# Patient Record
Sex: Male | Born: 1970 | Race: White | Hispanic: No | Marital: Married | Smoking: Never smoker
Health system: Southern US, Community
[De-identification: ages and names within clinical notes are randomized; demographics above are authoritative.]

## PROBLEM LIST (undated history)

## (undated) DIAGNOSIS — I2510 Atherosclerotic heart disease of native coronary artery without angina pectoris: Secondary | ICD-10-CM

## (undated) DIAGNOSIS — E7800 Pure hypercholesterolemia, unspecified: Secondary | ICD-10-CM

## (undated) DIAGNOSIS — I213 ST elevation (STEMI) myocardial infarction of unspecified site: Secondary | ICD-10-CM

## (undated) DIAGNOSIS — I4891 Unspecified atrial fibrillation: Secondary | ICD-10-CM

## (undated) HISTORY — PX: MITRAL VALVE REPAIR: SHX2039

---

## 2014-04-17 ENCOUNTER — Encounter (HOSPITAL_COMMUNITY): Payer: Self-pay | Admitting: *Deleted

## 2014-04-17 ENCOUNTER — Emergency Department (HOSPITAL_COMMUNITY)
Admission: EM | Admit: 2014-04-17 | Discharge: 2014-04-17 | Disposition: A | Discharge status: Home / Self Care | Payer: 59 | Attending: Emergency Medicine | Admitting: Emergency Medicine

## 2014-04-17 DIAGNOSIS — S199XXA Unspecified injury of neck, initial encounter: Secondary | ICD-10-CM | POA: Diagnosis not present

## 2014-04-17 DIAGNOSIS — M542 Cervicalgia: Secondary | ICD-10-CM

## 2014-04-17 DIAGNOSIS — Y998 Other external cause status: Secondary | ICD-10-CM | POA: Diagnosis not present

## 2014-04-17 DIAGNOSIS — S3992XA Unspecified injury of lower back, initial encounter: Secondary | ICD-10-CM | POA: Diagnosis not present

## 2014-04-17 DIAGNOSIS — Y92410 Unspecified street and highway as the place of occurrence of the external cause: Secondary | ICD-10-CM | POA: Diagnosis not present

## 2014-04-17 DIAGNOSIS — Y9389 Activity, other specified: Secondary | ICD-10-CM | POA: Insufficient documentation

## 2014-04-17 MED ORDER — CYCLOBENZAPRINE HCL 10 MG PO TABS: 10.0000 mg | ORAL_TABLET | Freq: Two times a day (BID) | ORAL | Status: DC | PRN

## 2014-04-17 MED ORDER — KETOROLAC TROMETHAMINE 30 MG/ML IJ SOLN
30.0000 mg | Freq: Once | INTRAMUSCULAR | Status: AC
  Administered 2014-04-17: 30 mg via INTRAMUSCULAR
  Filled 2014-04-17: qty 1

## 2014-04-17 NOTE — Discharge Instructions (Signed)
Return to the emergency room with worsening of symptoms, new symptoms or with symptoms that are concerning , especially severe worsening of headache, visual or speech changes, weakness in face, arms or legs, numbness, tingling. RICE: Rest, Ice (three cycles of 20 mins on, 20mins off at least twice a day), compression/brace, elevation. Heating pad works well for back pain. Ibuprofen 400mg  (2 tablets 200mg ) every 5-6 hours for 3-5 days. Flexeril for severe pain. Do not operate machinery, drive or drink alcohol while taking narcotics or muscle relaxers. Follow up with PCP if symptoms worsen or are persistent. Read below information and follow all recommendations   Cervical Sprain A cervical sprain is an injury in the neck in which the strong, fibrous tissues (ligaments) that connect your neck bones stretch or tear. Cervical sprains can range from mild to severe. Severe cervical sprains can cause the neck vertebrae to be unstable. This can lead to damage of the spinal cord and can result in serious nervous system problems. The amount of time it takes for a cervical sprain to get better depends on the cause and extent of the injury. Most cervical sprains heal in 1 to 3 weeks. CAUSES  Severe cervical sprains may be caused by:   Contact sport injuries (such as from football, rugby, wrestling, hockey, auto racing, gymnastics, diving, martial arts, or boxing).   Motor vehicle collisions.   Whiplash injuries. This is an injury from a sudden forward and backward whipping movement of the head and neck.  Falls.  Mild cervical sprains may be caused by:   Being in an awkward position, such as while cradling a telephone between your ear and shoulder.   Sitting in a chair that does not offer proper support.   Working at a poorly Marketing executivedesigned computer station.   Looking up or down for long periods of time.  SYMPTOMS   Pain, soreness, stiffness, or a burning sensation in the front, back, or sides of  the neck. This discomfort may develop immediately after the injury or slowly, 24 hours or more after the injury.   Pain or tenderness directly in the middle of the back of the neck.   Shoulder or upper back pain.   Limited ability to move the neck.   Headache.   Dizziness.   Weakness, numbness, or tingling in the hands or arms.   Muscle spasms.   Difficulty swallowing or chewing.   Tenderness and swelling of the neck.  DIAGNOSIS  Most of the time your health care provider can diagnose a cervical sprain by taking your history and doing a physical exam. Your health care provider will ask about previous neck injuries and any known neck problems, such as arthritis in the neck. X-rays may be taken to find out if there are any other problems, such as with the bones of the neck. Other tests, such as a CT scan or MRI, may also be needed.  TREATMENT  Treatment depends on the severity of the cervical sprain. Mild sprains can be treated with rest, keeping the neck in place (immobilization), and pain medicines. Severe cervical sprains are immediately immobilized. Further treatment is done to help with pain, muscle spasms, and other symptoms and may include:  Medicines, such as pain relievers, numbing medicines, or muscle relaxants.   Physical therapy. This may involve stretching exercises, strengthening exercises, and posture training. Exercises and improved posture can help stabilize the neck, strengthen muscles, and help stop symptoms from returning.  HOME CARE INSTRUCTIONS   Put ice on  the injured area.   Put ice in a plastic bag.   Place a towel between your skin and the bag.   Leave the ice on for 15-20 minutes, 3-4 times a day.   If your injury was severe, you may have been given a cervical collar to wear. A cervical collar is a two-piece collar designed to keep your neck from moving while it heals.  Do not remove the collar unless instructed by your health care  provider.  If you have long hair, keep it outside of the collar.  Ask your health care provider before making any adjustments to your collar. Minor adjustments may be required over time to improve comfort and reduce pressure on your chin or on the back of your head.  Ifyou are allowed to remove the collar for cleaning or bathing, follow your health care provider's instructions on how to do so safely.  Keep your collar clean by wiping it with mild soap and water and drying it completely. If the collar you have been given includes removable pads, remove them every 1-2 days and hand wash them with soap and water. Allow them to air dry. They should be completely dry before you wear them in the collar.  If you are allowed to remove the collar for cleaning and bathing, wash and dry the skin of your neck. Check your skin for irritation or sores. If you see any, tell your health care provider.  Do not drive while wearing the collar.   Only take over-the-counter or prescription medicines for pain, discomfort, or fever as directed by your health care provider.   Keep all follow-up appointments as directed by your health care provider.   Keep all physical therapy appointments as directed by your health care provider.   Make any needed adjustments to your workstation to promote good posture.   Avoid positions and activities that make your symptoms worse.   Warm up and stretch before being active to help prevent problems.  SEEK MEDICAL CARE IF:   Your pain is not controlled with medicine.   You are unable to decrease your pain medicine over time as planned.   Your activity level is not improving as expected.  SEEK IMMEDIATE MEDICAL CARE IF:   You develop any bleeding.  You develop stomach upset.  You have signs of an allergic reaction to your medicine.   Your symptoms get worse.   You develop new, unexplained symptoms.   You have numbness, tingling, weakness, or paralysis  in any part of your body.  MAKE SURE YOU:   Understand these instructions.  Will watch your condition.  Will get help right away if you are not doing well or get worse. Document Released: 12/22/2006 Document Revised: 03/01/2013 Document Reviewed: 09/01/2012 [facility] Patient Information 2015 [place], [place]. This information is not intended to replace advice given to you by your health care provider. Make sure you discuss any questions you have with your health care provider.
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
## 2014-04-17 NOTE — ED Notes (Signed)
Declined W/C at D/C and was escorted to lobby by RN. 

## 2016-01-27 ENCOUNTER — Emergency Department (HOSPITAL_COMMUNITY): Payer: BLUE CROSS/BLUE SHIELD

## 2016-01-27 ENCOUNTER — Encounter (HOSPITAL_COMMUNITY): Payer: Self-pay

## 2016-01-27 ENCOUNTER — Inpatient Hospital Stay (HOSPITAL_COMMUNITY)
Admission: EM | Admit: 2016-01-27 | Discharge: 2016-02-01 | DRG: 247 | Disposition: A | Discharge status: Home / Self Care | Payer: BLUE CROSS/BLUE SHIELD | Attending: Cardiology | Admitting: Cardiology

## 2016-01-27 DIAGNOSIS — I2129 ST elevation (STEMI) myocardial infarction involving other sites: Secondary | ICD-10-CM | POA: Diagnosis present

## 2016-01-27 DIAGNOSIS — Z7901 Long term (current) use of anticoagulants: Secondary | ICD-10-CM | POA: Diagnosis not present

## 2016-01-27 DIAGNOSIS — I482 Chronic atrial fibrillation: Secondary | ICD-10-CM | POA: Diagnosis present

## 2016-01-27 DIAGNOSIS — I2510 Atherosclerotic heart disease of native coronary artery without angina pectoris: Secondary | ICD-10-CM | POA: Diagnosis present

## 2016-01-27 DIAGNOSIS — Z23 Encounter for immunization: Secondary | ICD-10-CM | POA: Diagnosis not present

## 2016-01-27 DIAGNOSIS — E7800 Pure hypercholesterolemia, unspecified: Secondary | ICD-10-CM | POA: Diagnosis present

## 2016-01-27 DIAGNOSIS — I10 Essential (primary) hypertension: Secondary | ICD-10-CM | POA: Diagnosis present

## 2016-01-27 DIAGNOSIS — I059 Rheumatic mitral valve disease, unspecified: Secondary | ICD-10-CM | POA: Diagnosis present

## 2016-01-27 DIAGNOSIS — I214 Non-ST elevation (NSTEMI) myocardial infarction: Secondary | ICD-10-CM | POA: Diagnosis present

## 2016-01-27 DIAGNOSIS — E785 Hyperlipidemia, unspecified: Secondary | ICD-10-CM | POA: Diagnosis present

## 2016-01-27 DIAGNOSIS — I213 ST elevation (STEMI) myocardial infarction of unspecified site: Secondary | ICD-10-CM

## 2016-01-27 DIAGNOSIS — Z952 Presence of prosthetic heart valve: Secondary | ICD-10-CM

## 2016-01-27 DIAGNOSIS — Z955 Presence of coronary angioplasty implant and graft: Secondary | ICD-10-CM

## 2016-01-27 DIAGNOSIS — R079 Chest pain, unspecified: Secondary | ICD-10-CM

## 2016-01-27 HISTORY — DX: ST elevation (STEMI) myocardial infarction of unspecified site: I21.3

## 2016-01-27 HISTORY — DX: Unspecified atrial fibrillation: I48.91

## 2016-01-27 HISTORY — DX: Pure hypercholesterolemia, unspecified: E78.00

## 2016-01-27 HISTORY — DX: Atherosclerotic heart disease of native coronary artery without angina pectoris: I25.10

## 2016-01-27 LAB — CBC WITH DIFFERENTIAL/PLATELET
BASOS PCT: 0 %
Basophils Absolute: 0 10*3/uL (ref 0.0–0.1)
EOS ABS: 0 10*3/uL (ref 0.0–0.7)
EOS PCT: 0 %
HCT: 41 % (ref 39.0–52.0)
HEMOGLOBIN: 13.7 g/dL (ref 13.0–17.0)
Lymphocytes Relative: 16 %
Lymphs Abs: 1.1 10*3/uL (ref 0.7–4.0)
MCH: 28.1 pg (ref 26.0–34.0)
MCHC: 33.4 g/dL (ref 30.0–36.0)
MCV: 84.2 fL (ref 78.0–100.0)
Monocytes Absolute: 0.4 10*3/uL (ref 0.1–1.0)
Monocytes Relative: 6 %
NEUTROS PCT: 78 %
Neutro Abs: 5.4 10*3/uL (ref 1.7–7.7)
PLATELETS: 151 10*3/uL (ref 150–400)
RBC: 4.87 MIL/uL (ref 4.22–5.81)
RDW: 14.4 % (ref 11.5–15.5)
WBC: 6.9 10*3/uL (ref 4.0–10.5)

## 2016-01-27 LAB — CBC
HEMATOCRIT: 43 % (ref 39.0–52.0)
Hemoglobin: 14.4 g/dL (ref 13.0–17.0)
MCH: 28.3 pg (ref 26.0–34.0)
MCHC: 33.5 g/dL (ref 30.0–36.0)
MCV: 84.5 fL (ref 78.0–100.0)
Platelets: 146 10*3/uL — ABNORMAL LOW (ref 150–400)
RBC: 5.09 MIL/uL (ref 4.22–5.81)
RDW: 14.3 % (ref 11.5–15.5)
WBC: 6.2 10*3/uL (ref 4.0–10.5)

## 2016-01-27 LAB — TROPONIN I: Troponin I: 2.4 ng/mL (ref <0.03)

## 2016-01-27 LAB — BASIC METABOLIC PANEL
Anion gap: 10 (ref 5–15)
BUN: 7 mg/dL (ref 6–20)
CO2: 23 mmol/L (ref 22–32)
CREATININE: 0.77 mg/dL (ref 0.61–1.24)
Calcium: 9.3 mg/dL (ref 8.9–10.3)
Chloride: 105 mmol/L (ref 101–111)
GFR calc Af Amer: >60 mL/min (ref >60)
GLUCOSE: 125 mg/dL — AB (ref 65–99)
Potassium: 3.8 mmol/L (ref 3.5–5.1)
Sodium: 138 mmol/L (ref 135–145)

## 2016-01-27 LAB — PROTIME-INR
INR: 2.18
PROTHROMBIN TIME: 24.6 s — AB (ref 11.4–15.2)

## 2016-01-27 MED ORDER — NITROGLYCERIN IN D5W 200-5 MCG/ML-% IV SOLN
0.0000 ug/min | INTRAVENOUS | Status: DC
Start: 2016-01-27 — End: 2016-02-01
  Administered 2016-01-27: 5 ug/min via INTRAVENOUS
  Administered 2016-01-28 – 2016-01-29 (×2): 45 ug/min via INTRAVENOUS
  Filled 2016-01-27 (×3): qty 250

## 2016-01-27 MED ORDER — ASPIRIN 300 MG RE SUPP: 300.0000 mg | RECTAL | Status: AC

## 2016-01-27 MED ORDER — METOPROLOL TARTRATE 12.5 MG HALF TABLET
12.5000 mg | ORAL_TABLET | Freq: Two times a day (BID) | ORAL | Status: DC
  Administered 2016-01-27 – 2016-01-28 (×2): 12.5 mg via ORAL
  Filled 2016-01-27 (×2): qty 1

## 2016-01-27 MED ORDER — NITROGLYCERIN 0.4 MG SL SUBL
0.4000 mg | SUBLINGUAL_TABLET | SUBLINGUAL | Status: DC | PRN
  Administered 2016-01-27 (×2): 0.4 mg via SUBLINGUAL
  Filled 2016-01-27: qty 1

## 2016-01-27 MED ORDER — SODIUM CHLORIDE 0.9 % IV SOLN: INTRAVENOUS | Status: DC

## 2016-01-27 MED ORDER — ASPIRIN EC 81 MG PO TBEC
81.0000 mg | DELAYED_RELEASE_TABLET | Freq: Every day | ORAL | Status: DC
  Administered 2016-01-28 – 2016-02-01 (×5): 81 mg via ORAL
  Filled 2016-01-27 (×5): qty 1

## 2016-01-27 MED ORDER — DILTIAZEM HCL 100 MG IV SOLR: 5.0000 mg/h | Freq: Once | INTRAVENOUS | Status: DC

## 2016-01-27 MED ORDER — ONDANSETRON HCL 4 MG/2ML IJ SOLN: 4.0000 mg | Freq: Four times a day (QID) | INTRAMUSCULAR | Status: DC | PRN

## 2016-01-27 MED ORDER — CLOPIDOGREL BISULFATE 75 MG PO TABS
600.0000 mg | ORAL_TABLET | Freq: Every day | ORAL | Status: DC
  Administered 2016-01-27: 600 mg via ORAL
  Filled 2016-01-27: qty 2

## 2016-01-27 MED ORDER — DILTIAZEM HCL 25 MG/5ML IV SOLN
10.0000 mg | Freq: Once | INTRAVENOUS | Status: AC
  Administered 2016-01-27: 10 mg via INTRAVENOUS
  Filled 2016-01-27: qty 5

## 2016-01-27 MED ORDER — TIROFIBAN HCL IN NACL 5-0.9 MG/100ML-% IV SOLN
0.1500 ug/kg/min | INTRAVENOUS | Status: DC
Start: 2016-01-27 — End: 2016-01-28
  Administered 2016-01-27 – 2016-01-28 (×3): 0.15 ug/kg/min via INTRAVENOUS
  Filled 2016-01-27 (×6): qty 100

## 2016-01-27 MED ORDER — CLOPIDOGREL BISULFATE 75 MG PO TABS
75.0000 mg | ORAL_TABLET | Freq: Every day | ORAL | Status: DC
  Administered 2016-01-28 – 2016-01-29 (×2): 75 mg via ORAL
  Filled 2016-01-27 (×2): qty 1

## 2016-01-27 MED ORDER — MORPHINE SULFATE (PF) 4 MG/ML IV SOLN
4.0000 mg | Freq: Once | INTRAVENOUS | Status: AC
  Administered 2016-01-27: 4 mg via INTRAVENOUS
  Filled 2016-01-27: qty 1

## 2016-01-27 MED ORDER — PANTOPRAZOLE SODIUM 40 MG PO TBEC
40.0000 mg | DELAYED_RELEASE_TABLET | Freq: Every day | ORAL | Status: DC
  Administered 2016-01-28 – 2016-02-01 (×5): 40 mg via ORAL
  Filled 2016-01-27 (×5): qty 1

## 2016-01-27 MED ORDER — ACETAMINOPHEN 325 MG PO TABS: 650.0000 mg | ORAL_TABLET | ORAL | Status: DC | PRN

## 2016-01-27 MED ORDER — ATORVASTATIN CALCIUM 80 MG PO TABS
80.0000 mg | ORAL_TABLET | Freq: Every day | ORAL | Status: DC
  Administered 2016-01-28 – 2016-01-31 (×3): 80 mg via ORAL
  Filled 2016-01-27 (×3): qty 1

## 2016-01-27 MED ORDER — TIROFIBAN (AGGRASTAT) BOLUS VIA INFUSION
25.0000 ug/kg | Freq: Once | INTRAVENOUS | Status: AC
  Administered 2016-01-27: 1902.5 ug via INTRAVENOUS
  Filled 2016-01-27: qty 39

## 2016-01-27 MED ORDER — ASPIRIN 81 MG PO CHEW
324.0000 mg | CHEWABLE_TABLET | ORAL | Status: AC
  Administered 2016-01-27: 324 mg via ORAL
  Filled 2016-01-27: qty 4

## 2016-01-27 NOTE — Progress Notes (Signed)
eLink Physician-Brief Progress Note Patient Name: Erik BlacksmithHany Simpson DOB: 07/03/1970 MRN: [id number]   Date of Service  01/27/2016  HPI/Events of Note  Non-Qwave MI Admitted by cardiology Stable on camera check  eICU Interventions  No e-ICU intervention     Intervention Category Evaluation Type: New Patient Evaluation  Max FickleDouglas McQuaid 01/27/2016, 11:35 PM

## 2016-01-27 NOTE — ED Notes (Signed)
Patient transported to X-ray 

## 2016-01-27 NOTE — ED Notes (Signed)
Harwani, MD at bedside. 

## 2016-01-27 NOTE — H&P (Signed)
Erik Simpson is an 45 y.o. male.   Chief Complaint: Recurrent chest pain HPI: Patient is 45 year old male with past medical history significant for rheumatic heart disease chronic atrial fibrillation, hyperlipidemia, status post MVR in 1990 followed by mitral valve balloon valvuloplasty in 2000 in [place] drop to the ER complaining of recurrent retrosternal chest pain described as burning radiating to right arm which started around 3 AM off and on and then persistent chest pain since 8 AM this morning associated with nausea and syncopal episode for a few minutes. Patient denies any head trauma and denies any vomiting denies any shortness of breath patient took aspirin and received sublingual nitroglycerin with no real significant relief of chest pain and subsequently he got 600 mg of Plavix and morphine sulfate with almost relief of chest pain. EKG initially done showed A. fib with RVR with minimal ST elevation in V3 to V5 and reciprocal ST and lead 3 and aVF repeat EKG showed loss of 4 rate progression in V1 to eat 3 with minimal ST elevation in V4 V5 and last EKG done at 9:02 PM A. fib with RVR with normalization of R-wave progression and ST elevation in V4 V5. She was noted to have elevated troponin I of 2.40 and also elevated INR 2.18. Patient presently feels much better and almost chest pain-free.   Past Medical History:  Diagnosis Date  . Atrial fibrillation ([place])   . Hypercholesteremia     Past Surgical History:  Procedure Laterality Date  . MITRAL VALVE REPAIR     20 years ago    Family History  Problem Relation Age of Onset  . Coronary artery disease Mother    Social History:  reports that he has never smoked. He has never used smokeless tobacco. He reports that he does not drink alcohol or use drugs.  Allergies: No Known Allergies   (Not in a hospital admission)  Results for orders placed or performed during the hospital encounter of 01/27/16 (from the past 48 hour(s))  Basic  metabolic panel     Status: Abnormal   Collection Time: 01/27/16  5:47 PM  Result Value Ref Range   Sodium 138 135 - 145 mmol/L   Potassium 3.8 3.5 - 5.1 mmol/L   Chloride 105 101 - 111 mmol/L   CO2 23 22 - 32 mmol/L   Glucose, Bld 125 (H) 65 - 99 mg/dL   BUN 7 6 - 20 mg/dL   Creatinine, Ser 0.77 0.61 - 1.24 mg/dL   Calcium 9.3 8.9 - 10.3 mg/dL   GFR calc non Af Amer >60 >60 mL/min   GFR calc Af Amer >60 >60 mL/min    Comment: (NOTE) The eGFR has been calculated using the CKD EPI equation. This calculation has not been validated in all clinical situations. eGFR's persistently <60 mL/min signify possible Chronic Kidney Disease.    Anion gap 10 5 - 15  CBC     Status: Abnormal   Collection Time: 01/27/16  5:47 PM  Result Value Ref Range   WBC 6.2 4.0 - 10.5 K/uL   RBC 5.09 4.22 - 5.81 MIL/uL   Hemoglobin 14.4 13.0 - 17.0 g/dL   HCT 43.0 39.0 - 52.0 %   MCV 84.5 78.0 - 100.0 fL   MCH 28.3 26.0 - 34.0 pg   MCHC 33.5 30.0 - 36.0 g/dL   RDW 14.3 11.5 - 15.5 %   Platelets 146 (L) 150 - 400 K/uL  Protime-INR- (order if Patient is taking Coumadin /  Warfarin)     Status: Abnormal   Collection Time: 01/27/16  5:47 PM  Result Value Ref Range   Prothrombin Time 24.6 (H) 11.4 - 15.2 seconds   INR 2.18   Troponin I     Status: Abnormal   Collection Time: 01/27/16  5:47 PM  Result Value Ref Range   Troponin I 2.40 (HH) <0.03 ng/mL    Comment: CRITICAL RESULT CALLED TO, READ BACK BY AND VERIFIED WITH: [place] [id number] [place]    Dg Chest 2 View  Result Date: 01/27/2016 CLINICAL DATA:  Chest pain.  Fall earlier today EXAM: CHEST  2 VIEW COMPARISON:  None. FINDINGS: There is no edema or consolidation. Heart is mildly enlarged with pulmonary venous hypertension. No adenopathy. No pneumothorax. No bone lesions. IMPRESSION: Findings indicative of pulmonary vascular congestion without frank edema or consolidation. Electronically Signed   By: Lowella Grip III M.D.   On: 01/27/2016  17:16    Review of Systems  Constitutional: Negative for chills and fever.  Eyes: Negative for double vision.  Respiratory: Negative for cough and shortness of breath.   Cardiovascular: Positive for chest pain. Negative for palpitations and leg swelling.  Gastrointestinal: Positive for nausea. Negative for abdominal pain and vomiting.  Neurological: Positive for loss of consciousness.    Blood pressure 122/78, pulse 100, temperature 98.4 F (36.9 C), temperature source Oral, resp. rate 16, height '5\' 11"'$  (1.803 m), weight 167 lb 11.2 oz (76.1 kg), SpO2 99 %. Physical Exam  Constitutional: He is oriented to person, place, and time.  HENT:  Head: Normocephalic and atraumatic.  Eyes: Conjunctivae are normal. Pupils are equal, round, and reactive to light. Left eye exhibits no discharge. No scleral icterus.  Neck: Normal range of motion. No JVD present. No tracheal deviation present. No thyromegaly present.  Cardiovascular:  Cardiac irregularly irregular S1 and S2 soft there is soft systolic and diastolic murmur noted  Respiratory: Effort normal and breath sounds normal. No respiratory distress. He has no wheezes.  GI: Soft. Bowel sounds are normal. He exhibits no distension. There is no tenderness. There is no rebound.  Musculoskeletal: He exhibits no edema, tenderness or deformity.  Neurological: He is alert and oriented to person, place, and time.     Assessment/Plan Acute non-Q-wave myocardial infarction Rheumatic heart disease Status post mitral valve repair followed by balloon valvuloplasty in 2000 as above Chronic atrial fibrillation chadsvasc  score of 0 on chronic anticoagulation for mitral valve disease Hyperlipidemia History of syncope in the past History of inner ear problem. Plan As per orders Discussed with patient regarding various options of treatment as patient's chest pain and his EKG markedly improved continue with medical management for now until his INR is below  1.5 and then consider left cardiac catheterization possible PCI versus emergency left cath possible PTCA stenting its risk and benefits and agrees for medical management for now will schedule him for PCI in  a day or 2 unless patient shows significant new EKG changes and worsening chest pain. Will start Aggrastat  as well.   Charolette Forward, MD 01/27/2016, 9:22 PM

## 2016-01-27 NOTE — ED Provider Notes (Signed)
[facility]-EMERGENCY DEPT Provider Note   CSN: [id number] Arrival date & time: 01/27/16  1638     History   Chief Complaint Chief Complaint  Patient presents with  . Chest Pain    HPI Erik Simpson is a 45 y.o. male.  HPI  45 year old with history a fib on coumadin and hyercholesteremia presented with substernal chest pain started last night.  Intermittent last night. This morning started to get worse, radiated to R arm. No SOB no diaphoresis. Constant since then 6/10.   Took ASA pta.   10 am sat on toilet and LOC. Ho syncope due to "inner ear issue" in the past.     Past Medical History:  Diagnosis Date  . Atrial fibrillation (HCC)   . Hypercholesteremia     There are no active problems to display for this patient.   Past Surgical History:  Procedure Laterality Date  . MITRAL VALVE REPAIR     20 years ago       Home Medications    Prior to Admission medications   Medication Sig Start Date End Date Taking? Authorizing Provider  cyclobenzaprine (FLEXERIL) 10 MG tablet Take 1 tablet (10 mg total) by mouth 2 (two) times daily as needed for muscle spasms. 04/17/14   Erik ConroyVictoria Creech, PA-C    Family History Family History  Problem Relation Age of Onset  . Coronary artery disease Mother     Social History Social History  Substance Use Topics  . Smoking status: Never Smoker  . Smokeless tobacco: Never Used  . Alcohol use No     Allergies   Patient has no known allergies.   Review of Systems Review of Systems  Constitutional: Negative for fatigue and fever.  Cardiovascular: Positive for chest pain.  Gastrointestinal: Positive for nausea.  Neurological: Positive for syncope.  All other systems reviewed and are negative.    Physical Exam Updated Vital Signs BP 132/88   Pulse 99   Temp 98.4 F (36.9 C) (Oral)   Resp 18   Ht 5\' 11"  (1.803 m)   Wt 167 lb 11.2 oz (76.1 kg)   SpO2 100%   BMI 23.39 kg/m   Physical Exam  Constitutional: He is  oriented to person, place, and time. He appears well-nourished.  NAD  HENT:  Head: Normocephalic.  Eyes: Conjunctivae are normal.  Cardiovascular:  Irregularly irregular  Pulmonary/Chest: Effort normal and breath sounds normal. No respiratory distress.  Abdominal: Soft. There is no tenderness.  Musculoskeletal:  Bilateral legs no swellng  Neurological: He is oriented to person, place, and time.  Skin: Skin is warm and dry. He is not diaphoretic.  Psychiatric: He has a normal mood and affect. His behavior is normal.     ED Treatments / Results  Labs (all labs ordered are listed, but only abnormal results are displayed) Labs Reviewed  BASIC METABOLIC PANEL - Abnormal; Notable for the following:       Result Value   Glucose, Bld 125 (*)    All other components within normal limits  CBC - Abnormal; Notable for the following:    Platelets 146 (*)    All other components within normal limits  PROTIME-INR - Abnormal; Notable for the following:    Prothrombin Time 24.6 (*)    All other components within normal limits    EKG  EKG Interpretation  Date/Time:  Sunday January 27 2016 16:51:41 EST Ventricular Rate:  114 PR Interval:    QRS Duration: 72 QT Interval:  262 QTC Calculation: 361 R Axis:   73 Text Interpretation:  Atrial fibrillation with rapid ventricular response with premature ventricular or aberrantly conducted complexes Nonspecific ST abnormality Abnormal QRS-T angle, consider primary T wave abnormality Abnormal ECG Atrial fibrillation Confirmed by Erik Simpson, Erik [phone]([id number]) on 01/27/2016 4:55:37 PM       Radiology Dg Chest 2 View  Result Date: 01/27/2016 CLINICAL DATA:  Chest pain.  Fall earlier today EXAM: CHEST  2 VIEW COMPARISON:  None. FINDINGS: There is no edema or consolidation. Heart is mildly enlarged with pulmonary venous hypertension. No adenopathy. No pneumothorax. No bone lesions. IMPRESSION: Findings indicative of pulmonary vascular congestion  without frank edema or consolidation. Electronically Signed   By: Bretta BangWilliam  Woodruff Simpson M.D.   On: 01/27/2016 17:16    Procedures Procedures (including critical care time)  Medications Ordered in ED Medications - No data to display   Initial Impression / Assessment and Plan / ED Course  I have reviewed the triage vital signs and the nursing notes.  Pertinent labs & imaging results that were available during my care of the patient were reviewed by me and considered in my medical decision making (see chart for details).  Clinical Course    Patient is a 45 year old male presenting with chest pain since this morning. Patient had intermittent chest pain last night and then began to get worse this morning initially on arrival patient was in A. fib with an elevated rate. We slowedrate with diltiazem in effort to see if patient's pain was related to rate ischemia - mild elvations in lateral leads. Patient still had pain after lowered rate. SL nitro ordered. No troponin was ordered at triage so we ordered at once we saw the patient.   Dr. Algie Simpson paged.   8:25 PM Troponin elevated at 2.46. EKG still has mild elevations with midl dperession in 3.  Could go to cath as patietn still has pain. Touched base with Diplomatic Services operational officersecretary, and Dr. Sharyn Simpson now paged for coverage of Dr. Algie Simpson.  8:36 PM Harwani does not want to activate cath lab. INR is high, and given pain since this morning, Dr. Sharyn Simpson believes likely to be NSTEMI. Directions to give morphine, plavix and he will see patient.   9 pm   Dr. Sharyn Simpson at bedside, will start nitro drip, admit to CCU.  Repeat EKG shows resolution of ST elevations after thrid nitro/morphine.   CRITICAL CARE Performed by: Arlana Hoveourteney L Aniyah Nobis Total critical care time: 60 minutes Critical care time was exclusive of separately billable procedures and treating other patients. Critical care was necessary to treat or prevent imminent or life-threatening  deterioration. Critical care was time spent personally by me on the following activities: development of treatment plan with patient and/or surrogate as well as nursing, discussions with consultants, evaluation of patient's response to treatment, examination of patient, obtaining history from patient or surrogate, ordering and performing treatments and interventions, ordering and review of laboratory studies, ordering and review of radiographic studies, pulse oximetry and re-evaluation of patient's condition.    Final Clinical Impressions(s) / ED Diagnoses   Final diagnoses:  None    New Prescriptions New Prescriptions   No medications on file     Meyer Arora Randall AnLyn Avielle Imbert, MD 01/27/16 2216

## 2016-01-27 NOTE — ED Notes (Signed)
Attempted report 

## 2016-01-27 NOTE — ED Triage Notes (Signed)
Onset last night chest pain, substernal, burning, constant, pain staying same.  Pt reports onset 10am today he was sitting on toilet, lost consciousness, fell back against toilet, wife at side 5 min later when he woke up.

## 2016-01-27 NOTE — Progress Notes (Signed)
Received report re: this patient from RN Ranelle OysterMichelle Beale. Waiting pt's arrival to 2S04.

## 2016-01-27 NOTE — ED Notes (Signed)
Critical Trop [facility]40 [facility] LPM[facility]uen, MD.

## 2016-01-28 LAB — PROTIME-INR
INR: 2.08
INR: 2.1
PROTHROMBIN TIME: 23.9 s — AB (ref 11.4–15.2)
Prothrombin Time: 23.7 seconds — ABNORMAL HIGH (ref 11.4–15.2)

## 2016-01-28 LAB — CBC
HCT: 39 % (ref 39.0–52.0)
Hemoglobin: 12.8 g/dL — ABNORMAL LOW (ref 13.0–17.0)
MCH: 27.6 pg (ref 26.0–34.0)
MCHC: 32.8 g/dL (ref 30.0–36.0)
MCV: 84.2 fL (ref 78.0–100.0)
PLATELETS: 142 10*3/uL — AB (ref 150–400)
RBC: 4.63 MIL/uL (ref 4.22–5.81)
RDW: 14.7 % (ref 11.5–15.5)
WBC: 7.3 10*3/uL (ref 4.0–10.5)

## 2016-01-28 LAB — COMPREHENSIVE METABOLIC PANEL
ALBUMIN: 4.1 g/dL (ref 3.5–5.0)
ALT: 66 U/L — ABNORMAL HIGH (ref 17–63)
ANION GAP: 9 (ref 5–15)
AST: 141 U/L — AB (ref 15–41)
Alkaline Phosphatase: 58 U/L (ref 38–126)
BILIRUBIN TOTAL: 0.8 mg/dL (ref 0.3–1.2)
BUN: 6 mg/dL (ref 6–20)
CHLORIDE: 104 mmol/L (ref 101–111)
CO2: 25 mmol/L (ref 22–32)
Calcium: 9.2 mg/dL (ref 8.9–10.3)
Creatinine, Ser: 0.9 mg/dL (ref 0.61–1.24)
GFR calc Af Amer: >60 mL/min (ref >60)
GLUCOSE: 123 mg/dL — AB (ref 65–99)
POTASSIUM: 4 mmol/L (ref 3.5–5.1)
Sodium: 138 mmol/L (ref 135–145)
TOTAL PROTEIN: 7.2 g/dL (ref 6.5–8.1)

## 2016-01-28 LAB — BASIC METABOLIC PANEL
Anion gap: 8 (ref 5–15)
BUN: 6 mg/dL (ref 6–20)
CHLORIDE: 104 mmol/L (ref 101–111)
CO2: 24 mmol/L (ref 22–32)
CREATININE: 0.68 mg/dL (ref 0.61–1.24)
Calcium: 8.8 mg/dL — ABNORMAL LOW (ref 8.9–10.3)
GFR calc Af Amer: >60 mL/min (ref >60)
GFR calc non Af Amer: >60 mL/min (ref >60)
GLUCOSE: 111 mg/dL — AB (ref 65–99)
POTASSIUM: 3.8 mmol/L (ref 3.5–5.1)
Sodium: 136 mmol/L (ref 135–145)

## 2016-01-28 LAB — TROPONIN I
TROPONIN I: 16.1 ng/mL — AB (ref <0.03)
TROPONIN I: 37.25 ng/mL — AB (ref <0.03)
TROPONIN I: 46.39 ng/mL — AB (ref <0.03)

## 2016-01-28 LAB — LIPID PANEL
CHOL/HDL RATIO: 2.4 ratio
Cholesterol: 114 mg/dL (ref 0–200)
HDL: 47 mg/dL (ref >40)
LDL CALC: 54 mg/dL (ref 0–99)
Triglycerides: 65 mg/dL (ref <150)
VLDL: 13 mg/dL (ref 0–40)

## 2016-01-28 LAB — MAGNESIUM: MAGNESIUM: 1.6 mg/dL — AB (ref 1.7–2.4)

## 2016-01-28 LAB — TSH: TSH: 0.485 u[IU]/mL (ref 0.350–4.500)

## 2016-01-28 LAB — MRSA PCR SCREENING: MRSA BY PCR: NEGATIVE

## 2016-01-28 MED ORDER — SODIUM CHLORIDE 0.9% FLUSH
3.0000 mL | Freq: Two times a day (BID) | INTRAVENOUS | Status: DC
  Administered 2016-01-29: 3 mL via INTRAVENOUS

## 2016-01-28 MED ORDER — TIROFIBAN (AGGRASTAT) BOLUS VIA INFUSION
25.0000 ug/kg | Freq: Once | INTRAVENOUS | Status: DC
  Administered 2016-01-28: 1902.5 ug via INTRAVENOUS

## 2016-01-28 MED ORDER — SODIUM CHLORIDE 0.9% FLUSH: 3.0000 mL | INTRAVENOUS | Status: DC | PRN

## 2016-01-28 MED ORDER — PHYTONADIONE 5 MG PO TABS
5.0000 mg | ORAL_TABLET | Freq: Once | ORAL | Status: AC
  Administered 2016-01-28: 5 mg via ORAL
  Filled 2016-01-28: qty 1

## 2016-01-28 MED ORDER — AMIODARONE HCL 200 MG PO TABS
200.0000 mg | ORAL_TABLET | Freq: Two times a day (BID) | ORAL | Status: DC
  Administered 2016-01-28 – 2016-02-01 (×8): 200 mg via ORAL
  Filled 2016-01-28 (×8): qty 1

## 2016-01-28 MED ORDER — SODIUM CHLORIDE 0.9 % IV SOLN: 250.0000 mL | INTRAVENOUS | Status: DC | PRN

## 2016-01-28 MED ORDER — METOPROLOL TARTRATE 25 MG PO TABS
25.0000 mg | ORAL_TABLET | Freq: Two times a day (BID) | ORAL | Status: DC
  Administered 2016-01-28 – 2016-01-29 (×2): 25 mg via ORAL
  Filled 2016-01-28 (×2): qty 1

## 2016-01-28 MED ORDER — SODIUM CHLORIDE 0.9 % IV SOLN
INTRAVENOUS | Status: DC
  Administered 2016-01-29 (×2): 250 mL via INTRAVENOUS
  Administered 2016-01-29: 11:00:00 via INTRAVENOUS
  Administered 2016-01-29: 250 mL via INTRAVENOUS

## 2016-01-28 MED ORDER — HEPARIN (PORCINE) IN NACL 100-0.45 UNIT/ML-% IJ SOLN
1000.0000 [IU]/h | INTRAMUSCULAR | Status: DC
  Administered 2016-01-28: 1000 [IU]/h via INTRAVENOUS
  Filled 2016-01-28 (×2): qty 250

## 2016-01-28 NOTE — Progress Notes (Signed)
ANTICOAGULATION CONSULT NOTE - Initial Consult  Pharmacy Consult for Heparin Indication: chest pain/ACS  No Known Allergies  Patient Measurements: Height: 5\' 11"  (180.3 cm) Weight: 166 lb 0.1 oz (75.3 kg) IBW/kg (Calculated) : 75.3  Vital Signs: Temp: 100 F (37.8 C) (11/20 1604) Temp Source: Oral (11/20 1604) BP: 101/79 (11/20 1400) Pulse Rate: 95 (11/20 1400)  Labs:  Recent Labs  01/27/16 1747 01/27/16 2324 01/28/16 0503 01/28/16 1233  HGB 14.4 13.7 12.8*  --   HCT 43.0 41.0 39.0  --   PLT 146* 151 142*  --   LABPROT 24.6* 23.7* 23.9*  --   INR 2.18 2.08 2.10  --   CREATININE 0.77 0.90 0.68  --   TROPONINI 2.40* 16.10* 37.25* 46.39*    Estimated Creatinine Clearance: 121.6 mL/min (by C-G formula based on SCr of 0.68 mg/dL).   Medical History: Past Medical History:  Diagnosis Date  . Atrial fibrillation (HCC)   . Hypercholesteremia    Assessment: 45yom on coumadin pta for afib, admitted with NSTEMI. INR 2.18 on admit, coumadin held, and he was started on aggrastat. INR remains 2.1 today - plan to d/c aggrastat, give vitamin k 5mg  po x 1, and start heparin anticipating cath once INR < 1.5.   Goal of Therapy:  Heparin level 0.3-0.7 units/ml Monitor platelets by anticoagulation protocol: Yes   Plan:  1) At 2000 tonight, start heparin at 1000 units/hr 2) Check 6 hour heparin level 3) Daily heparin level, CBC, INR  Fredrik RiggerMarkle, Aaliah Jorgenson Sue 01/28/2016,4:06 PM

## 2016-01-28 NOTE — Progress Notes (Signed)
Ref: No PCP Per Patient   Subjective:  Had chest pain 2-3 hours ago. Now chest pain free. INR remains elevated. Finished Aggrastat infusion.  Objective:  Vital Signs in the last 24 hours: Temp:  [98.4 F (36.9 C)-99.6 F (37.6 C)] 99.6 F (37.6 C) (11/20 1218) Pulse Rate:  [55-122] 95 (11/20 1400) Cardiac Rhythm: Atrial fibrillation (11/20 0800) Resp:  [10-21] 19 (11/20 1400) BP: (97-149)/(59-99) 101/79 (11/20 1400) SpO2:  [97 %-100 %] 98 % (11/20 1400) Weight:  [75.3 kg (166 lb 0.1 oz)-76.1 kg (167 lb 11.2 oz)] 75.3 kg (166 lb 0.1 oz) (11/19 2330)  Physical Exam: BP Readings from Last 1 Encounters:  01/28/16 101/79    Wt Readings from Last 1 Encounters:  01/27/16 75.3 kg (166 lb 0.1 oz)    Weight change:  Body mass index is 23.15 kg/m. HEENT: [place]/AT, Eyes-Brown, PERL, EOMI, Conjunctiva-Pink, Sclera-Non-icteric Neck: No JVD, No bruit, Trachea midline. Lungs:  Clear, Bilateral. Cardiac:  Irregular rhythm, normal S1 and S2, no S3. II/VI systolic murmur. Abdomen:  Soft, non-tender. BS present. Extremities:  No edema present. No cyanosis. No clubbing. CNS: AxOx3, Cranial nerves grossly intact, moves all 4 extremities.  Skin: Warm and dry.   Intake/Output from previous day: 11/19 0701 - 11/20 0700 In: 332.6 [P.O.:90; I.V.:242.6] Out: 425 [Urine:425]    Lab Results: BMET    Component Value Date/Time   NA 136 01/28/2016 0503   NA 138 01/27/2016 2324   NA 138 01/27/2016 1747   K 3.8 01/28/2016 0503   K 4.0 01/27/2016 2324   K 3.8 01/27/2016 1747   CL 104 01/28/2016 0503   CL 104 01/27/2016 2324   CL 105 01/27/2016 1747   CO2 24 01/28/2016 0503   CO2 25 01/27/2016 2324   CO2 23 01/27/2016 1747   GLUCOSE 111 (H) 01/28/2016 0503   GLUCOSE 123 (H) 01/27/2016 2324   GLUCOSE 125 (H) 01/27/2016 1747   BUN 6 01/28/2016 0503   BUN 6 01/27/2016 2324   BUN 7 01/27/2016 1747   CREATININE 0.68 01/28/2016 0503   CREATININE 0.90 01/27/2016 2324   CREATININE 0.77 01/27/2016  1747   CALCIUM 8.8 (L) 01/28/2016 0503   CALCIUM 9.2 01/27/2016 2324   CALCIUM 9.3 01/27/2016 1747   GFRNONAA >60 01/28/2016 0503   GFRNONAA >60 01/27/2016 2324   GFRNONAA >60 01/27/2016 1747   GFRAA >60 01/28/2016 0503   GFRAA >60 01/27/2016 2324   GFRAA >60 01/27/2016 1747   CBC    Component Value Date/Time   WBC 7.3 01/28/2016 0503   RBC 4.63 01/28/2016 0503   HGB 12.8 (L) 01/28/2016 0503   HCT 39.0 01/28/2016 0503   PLT 142 (L) 01/28/2016 0503   MCV 84.2 01/28/2016 0503   MCH 27.6 01/28/2016 0503   MCHC 32.8 01/28/2016 0503   RDW 14.7 01/28/2016 0503   LYMPHSABS 1.1 01/27/2016 2324   MONOABS 0.4 01/27/2016 2324   EOSABS 0.0 01/27/2016 2324   BASOSABS 0.0 01/27/2016 2324   HEPATIC Function Panel  Recent Labs  01/27/16 2324  PROT 7.2   HEMOGLOBIN A1C No components found for: HGA1C,  MPG CARDIAC ENZYMES Lab Results  Component Value Date   TROPONINI 46.39 (HH) 01/28/2016   TROPONINI 37.25 (HH) 01/28/2016   TROPONINI 16.10 (HH) 01/27/2016   BNP No results for input(s): PROBNP in the last 8760 hours. TSH  Recent Labs  01/27/16 2324  TSH 0.485   CHOLESTEROL  Recent Labs  01/28/16 0503  CHOL 114    Scheduled  Meds: . aspirin EC  81 mg Oral Daily  . atorvastatin  80 mg Oral q1800  . clopidogrel  75 mg Oral Q breakfast  . metoprolol tartrate  12.5 mg Oral BID  . pantoprazole  40 mg Oral Q0600  . phytonadione  5 mg Oral Once   Continuous Infusions: . sodium chloride    . nitroGLYCERIN 45 mcg/min (01/28/16 1400)   PRN Meds:.acetaminophen, nitroGLYCERIN, ondansetron (ZOFRAN) IV  Assessment/Plan: Acute MI CAD Atrial fibrillation with moderate ventricular response CHA2DSVASc score of 0 S/P Mitral valve repair Hyperlipidemia  Small dose vitamin K and anticoagulation with IV heparin. Cardiac cath in AM or today if symptoms return.   LOS: 1 day    Erik CobbAjay Raeley Gilmore  MD  01/28/2016, 3:46 PM

## 2016-01-29 ENCOUNTER — Other Ambulatory Visit: Payer: Self-pay

## 2016-01-29 ENCOUNTER — Encounter (HOSPITAL_COMMUNITY)
Admission: EM | Disposition: A | Discharge status: Home / Self Care | Payer: Self-pay | Source: Home / Self Care | Attending: Cardiology

## 2016-01-29 ENCOUNTER — Encounter (HOSPITAL_COMMUNITY): Payer: Self-pay | Admitting: General Practice

## 2016-01-29 HISTORY — PX: CORONARY ANGIOPLASTY WITH STENT PLACEMENT: SHX49

## 2016-01-29 HISTORY — PX: CARDIAC CATHETERIZATION: SHX172

## 2016-01-29 LAB — CBC
HEMATOCRIT: 37.5 % — AB (ref 39.0–52.0)
Hemoglobin: 12.7 g/dL — ABNORMAL LOW (ref 13.0–17.0)
MCH: 28.5 pg (ref 26.0–34.0)
MCHC: 33.9 g/dL (ref 30.0–36.0)
MCV: 84.3 fL (ref 78.0–100.0)
Platelets: 148 10*3/uL — ABNORMAL LOW (ref 150–400)
RBC: 4.45 MIL/uL (ref 4.22–5.81)
RDW: 14.5 % (ref 11.5–15.5)
WBC: 9.5 10*3/uL (ref 4.0–10.5)

## 2016-01-29 LAB — POCT ACTIVATED CLOTTING TIME: Activated Clotting Time: 478 seconds

## 2016-01-29 LAB — HEPARIN LEVEL (UNFRACTIONATED)
Heparin Unfractionated: 0.61 IU/mL (ref 0.30–0.70)
Heparin Unfractionated: 0.87 IU/mL — ABNORMAL HIGH (ref 0.30–0.70)

## 2016-01-29 LAB — PROTIME-INR
INR: 1.73
PROTHROMBIN TIME: 20.4 s — AB (ref 11.4–15.2)

## 2016-01-29 SURGERY — LEFT HEART CATH AND CORONARY ANGIOGRAPHY

## 2016-01-29 MED ORDER — LABETALOL HCL 5 MG/ML IV SOLN: 10.0000 mg | INTRAVENOUS | Status: AC | PRN

## 2016-01-29 MED ORDER — SODIUM CHLORIDE 0.9% FLUSH
3.0000 mL | Freq: Two times a day (BID) | INTRAVENOUS | Status: DC
  Administered 2016-01-30 – 2016-01-31 (×2): 3 mL via INTRAVENOUS

## 2016-01-29 MED ORDER — HEPARIN (PORCINE) IN NACL 2-0.9 UNIT/ML-% IJ SOLN
INTRAMUSCULAR | Status: AC
  Filled 2016-01-29: qty 1000

## 2016-01-29 MED ORDER — NITROGLYCERIN 1 MG/10 ML FOR IR/CATH LAB
INTRA_ARTERIAL | Status: DC | PRN
  Administered 2016-01-29 (×2): 100 ug via INTRACORONARY

## 2016-01-29 MED ORDER — TICAGRELOR 90 MG PO TABS
ORAL_TABLET | ORAL | Status: DC | PRN
  Administered 2016-01-29: 180 mg via ORAL

## 2016-01-29 MED ORDER — IOPAMIDOL (ISOVUE-370) INJECTION 76%
INTRAVENOUS | Status: AC
  Filled 2016-01-29: qty 100

## 2016-01-29 MED ORDER — HEPARIN (PORCINE) IN NACL 100-0.45 UNIT/ML-% IJ SOLN
850.0000 [IU]/h | INTRAMUSCULAR | Status: DC
  Administered 2016-01-30 – 2016-01-31 (×2): 850 [IU]/h via INTRAVENOUS
  Filled 2016-01-29 (×2): qty 250

## 2016-01-29 MED ORDER — SODIUM CHLORIDE 0.9 % IV SOLN: 250.0000 mL | INTRAVENOUS | Status: DC | PRN

## 2016-01-29 MED ORDER — SODIUM CHLORIDE 0.9% FLUSH: 3.0000 mL | INTRAVENOUS | Status: DC | PRN

## 2016-01-29 MED ORDER — BIVALIRUDIN BOLUS VIA INFUSION - CUPID
INTRAVENOUS | Status: DC | PRN
  Administered 2016-01-29: 55.575 mg via INTRAVENOUS

## 2016-01-29 MED ORDER — HEPARIN (PORCINE) IN NACL 2-0.9 UNIT/ML-% IJ SOLN
INTRAMUSCULAR | Status: DC | PRN
  Administered 2016-01-29: 1000 mL

## 2016-01-29 MED ORDER — IOPAMIDOL (ISOVUE-370) INJECTION 76%
INTRAVENOUS | Status: AC
  Filled 2016-01-29: qty 50

## 2016-01-29 MED ORDER — IOPAMIDOL (ISOVUE-370) INJECTION 76%
INTRAVENOUS | Status: DC | PRN
  Administered 2016-01-29: 50 mL via INTRA_ARTERIAL

## 2016-01-29 MED ORDER — BIVALIRUDIN 250 MG IV SOLR
INTRAVENOUS | Status: AC
  Filled 2016-01-29: qty 250

## 2016-01-29 MED ORDER — FENTANYL CITRATE (PF) 100 MCG/2ML IJ SOLN
INTRAMUSCULAR | Status: DC | PRN
  Administered 2016-01-29: 25 ug via INTRAVENOUS

## 2016-01-29 MED ORDER — INFLUENZA VAC SPLIT QUAD 0.5 ML IM SUSY
0.5000 mL | PREFILLED_SYRINGE | INTRAMUSCULAR | Status: AC
  Administered 2016-01-30: 11:00:00 0.5 mL via INTRAMUSCULAR
  Filled 2016-01-29: qty 0.5

## 2016-01-29 MED ORDER — HEPARIN (PORCINE) IN NACL 2-0.9 UNIT/ML-% IJ SOLN
INTRAMUSCULAR | Status: AC
Start: 2016-01-29 — End: 2016-01-29
  Filled 2016-01-29: qty 500

## 2016-01-29 MED ORDER — SODIUM CHLORIDE 0.9 % IV SOLN
INTRAVENOUS | Status: DC | PRN
  Administered 2016-01-29: 1.75 mg/kg/h via INTRAVENOUS

## 2016-01-29 MED ORDER — TICAGRELOR 90 MG PO TABS
ORAL_TABLET | ORAL | Status: AC
  Filled 2016-01-29: qty 2

## 2016-01-29 MED ORDER — TICAGRELOR 90 MG PO TABS
90.0000 mg | ORAL_TABLET | Freq: Two times a day (BID) | ORAL | Status: DC
  Administered 2016-01-30 – 2016-02-01 (×6): 90 mg via ORAL
  Filled 2016-01-29 (×6): qty 1

## 2016-01-29 MED ORDER — FENTANYL CITRATE (PF) 100 MCG/2ML IJ SOLN
INTRAMUSCULAR | Status: AC
  Filled 2016-01-29: qty 2

## 2016-01-29 MED ORDER — NITROGLYCERIN 1 MG/10 ML FOR IR/CATH LAB
INTRA_ARTERIAL | Status: AC
  Filled 2016-01-29: qty 10

## 2016-01-29 MED ORDER — LIDOCAINE HCL (PF) 1 % IJ SOLN
INTRAMUSCULAR | Status: DC | PRN
  Administered 2016-01-29: 15 mL

## 2016-01-29 MED ORDER — MIDAZOLAM HCL 2 MG/2ML IJ SOLN
INTRAMUSCULAR | Status: AC
  Filled 2016-01-29: qty 2

## 2016-01-29 MED ORDER — METOPROLOL TARTRATE 50 MG PO TABS
50.0000 mg | ORAL_TABLET | Freq: Two times a day (BID) | ORAL | Status: DC
  Administered 2016-01-29 – 2016-02-01 (×6): 50 mg via ORAL
  Filled 2016-01-29 (×2): qty 2
  Filled 2016-01-29 (×2): qty 1
  Filled 2016-01-29 (×2): qty 2

## 2016-01-29 MED ORDER — LIDOCAINE HCL (PF) 1 % IJ SOLN
INTRAMUSCULAR | Status: AC
  Filled 2016-01-29: qty 30

## 2016-01-29 MED ORDER — HEPARIN (PORCINE) IN NACL 2-0.9 UNIT/ML-% IJ SOLN
INTRAMUSCULAR | Status: DC | PRN
  Administered 2016-01-29: 500 mL

## 2016-01-29 MED ORDER — IOPAMIDOL (ISOVUE-370) INJECTION 76%
INTRAVENOUS | Status: DC | PRN
  Administered 2016-01-29: 230 mL via INTRA_ARTERIAL

## 2016-01-29 MED ORDER — MIDAZOLAM HCL 2 MG/2ML IJ SOLN
INTRAMUSCULAR | Status: DC | PRN
  Administered 2016-01-29: 1 mg via INTRAVENOUS

## 2016-01-29 MED ORDER — SODIUM CHLORIDE 0.9 % IV SOLN: INTRAVENOUS | Status: AC

## 2016-01-29 MED ORDER — HYDRALAZINE HCL 20 MG/ML IJ SOLN: 5.0000 mg | INTRAMUSCULAR | Status: AC | PRN

## 2016-01-29 SURGICAL SUPPLY — 14 items
BALLN EMERGE MR 2.5X12 (BALLOONS) ×3
BALLOON EMERGE MR 2.5X12 (BALLOONS) ×1 IMPLANT
CATH INFINITI 5FR MULTPACK ANG (CATHETERS) ×3 IMPLANT
CATH VISTA GUIDE 6FR XBLAD3.0 (CATHETERS) ×3 IMPLANT
KIT ENCORE 26 ADVANTAGE (KITS) ×3 IMPLANT
KIT HEART LEFT (KITS) ×3 IMPLANT
NEEDLE SMART REG 18GX2-3/4 (NEEDLE) ×3 IMPLANT
PACK CARDIAC CATHETERIZATION (CUSTOM PROCEDURE TRAY) ×3 IMPLANT
SHEATH PINNACLE 5F 10CM (SHEATH) ×3 IMPLANT
SHEATH PINNACLE 6F 10CM (SHEATH) ×3 IMPLANT
STENT XIENCE ALPINE RX 2.75X18 (Permanent Stent) ×3 IMPLANT
TRANSDUCER W/STOPCOCK (MISCELLANEOUS) ×3 IMPLANT
WIRE EMERALD 3MM-J .035X150CM (WIRE) ×3 IMPLANT
WIRE PT2 MS 185 (WIRE) ×3 IMPLANT

## 2016-01-29 NOTE — Progress Notes (Signed)
ANTICOAGULATION CONSULT NOTE - FOLLOW UP  Pharmacy Consult:  Heparin Indication: chest pain/ACS  No Known Allergies  Patient Measurements: Height: 5\' 11"  (180.3 cm) Weight: 163 lb 5.8 oz (74.1 kg) IBW/kg (Calculated) : 75.3  Vital Signs: Temp: 99.4 F (37.4 C) (11/21 0700) Temp Source: Oral (11/21 0700) BP: 98/69 (11/21 1200) Pulse Rate: 93 (11/21 1200)  Labs:  Recent Labs  01/27/16 1747 01/27/16 2324 01/28/16 0503 01/28/16 1233 01/29/16 0239 01/29/16 1113  HGB 14.4 13.7 12.8*  --  12.7*  --   HCT 43.0 41.0 39.0  --  37.5*  --   PLT 146* 151 142*  --  148*  --   LABPROT 24.6* 23.7* 23.9*  --  20.4*  --   INR 2.18 2.08 2.10  --  1.73  --   HEPARINUNFRC  --   --   --   --  0.61 0.87*  CREATININE 0.77 0.90 0.68  --   --   --   TROPONINI 2.40* 16.10* 37.25* 46.39*  --   --     Estimated Creatinine Clearance: 119.6 mL/min (by C-G formula based on SCr of 0.68 mg/dL).   Assessment: 45 YOM on Coumadin PTA for history of Afib, admitted with NSTEMI.  Coumadin was reversed and IV heparin initiated on 01/28/16.  Heparin level was supra-therapeutic this AM, then it was turned off for cath.   Goal of Therapy:  Heparin level 0.3-0.7 units/ml Monitor platelets by anticoagulation protocol: Yes    Plan:  - F/U anticoagulation plan post cath    Nesreen Albano D. Laney Potashang, PharmD, BCPS Pager:  [phone] 01/29/2016, 1:07 PM

## 2016-01-29 NOTE — Progress Notes (Signed)
Site area: right groin  Site Prior to Removal:  Level 0  Pressure Applied For 20 MINUTES    Minutes Beginning at 1815  Manual:   Yes.    Patient Status During Pull:  AAO  x 4  Post Pull Groin Site:  Level 0  Post Pull Instructions Given:  Yes.    Post Pull Pulses Present:  Yes.    Dressing Applied:  Yes.    Comments:  TOLERATED PROCEDURE WELL

## 2016-01-29 NOTE — Progress Notes (Signed)
ANTICOAGULATION CONSULT NOTE   Pharmacy Consult for Heparin Indication: chest pain/ACS  No Known Allergies  Patient Measurements: Height: 5\' 11"  (180.3 cm) Weight: 166 lb 0.1 oz (75.3 kg) IBW/kg (Calculated) : 75.3  Vital Signs: Temp: 100.1 F (37.8 C) (11/21 0110) Temp Source: Oral (11/21 0110) BP: 113/64 (11/21 0100) Pulse Rate: 100 (11/21 0100)  Labs:  Recent Labs  01/27/16 1747 01/27/16 2324 01/28/16 0503 01/28/16 1233 01/29/16 0239  HGB 14.4 13.7 12.8*  --  12.7*  HCT 43.0 41.0 39.0  --  37.5*  PLT 146* 151 142*  --  148*  LABPROT 24.6* 23.7* 23.9*  --  20.4*  INR 2.18 2.08 2.10  --  1.73  HEPARINUNFRC  --   --   --   --  0.61  CREATININE 0.77 0.90 0.68  --   --   TROPONINI 2.40* 16.10* 37.25* 46.39*  --     Estimated Creatinine Clearance: 121.6 mL/min (by C-G formula based on SCr of 0.68 mg/dL).   Medical History: Past Medical History:  Diagnosis Date  . Atrial fibrillation (HCC)   . Hypercholesteremia    Assessment: 45yom on coumadin pta for afib, admitted with NSTEMI. INR 2.18 on admit, coumadin held, and he was started on aggrastat. INR remains 2.1 today - plan to d/c aggrastat, give vitamin k 5mg  po x 1, and start heparin anticipating cath once INR < 1.5. Initial heparin level is therapeutic at 0.61. CBC stable.   Goal of Therapy:  Heparin level 0.3-0.7 units/ml Monitor platelets by anticoagulation protocol: Yes   Plan:  1) Continue heparin at 1000 units/hr 2) Check 6 hour confirmation heparin level 3) Daily heparin level, CBC, INR 4) Follow up anticoagulation plans post cath   Pollyann SamplesAndy Kuzey Ogata, PharmD, BCPS 01/29/2016, 3:16 AM Pager: [phone]

## 2016-01-29 NOTE — Progress Notes (Signed)
ANTICOAGULATION CONSULT NOTE - Follow Up Consult  Pharmacy Consult for Heparin and Coumadin Indication: atrial fibrillation  No Known Allergies  Patient Measurements: Height: 5\' 11"  (180.3 cm) Weight: 163 lb 5.8 oz (74.1 kg) IBW/kg (Calculated) : 75.3 Heparin Dosing Weight: 74.1 kg  Vital Signs: Temp: 97 F (36.1 C) (11/21 1612) Temp Source: Axillary (11/21 1612) BP: 139/86 (11/21 1612) Pulse Rate: 88 (11/21 1612)  Labs:  Recent Labs  01/27/16 1747 01/27/16 2324 01/28/16 0503 01/28/16 1233 01/29/16 0239 01/29/16 1113  HGB 14.4 13.7 12.8*  --  12.7*  --   HCT 43.0 41.0 39.0  --  37.5*  --   PLT 146* 151 142*  --  148*  --   LABPROT 24.6* 23.7* 23.9*  --  20.4*  --   INR 2.18 2.08 2.10  --  1.73  --   HEPARINUNFRC  --   --   --   --  0.61 0.87*  CREATININE 0.77 0.90 0.68  --   --   --   TROPONINI 2.40* 16.10* 37.25* 46.39*  --   --     Estimated Creatinine Clearance: 119.6 mL/min (by C-G formula based on SCr of 0.68 mg/dL).  Assessment:  45 YOM on Coumadin PTA for history of Afib, admitted with NSTEMI.  Coumadin was reversed and IV heparin initiated on 01/28/16.    S/p PCI today.  Spoke with Dr. Algie CofferKadakia this afternoon.  Heparin to resume 8 hrs after sheath out, and Coumadin to resume on 01/30/16.  Brilinta loading dose given in Cath Lab and to continue along with ASA 81 mg daily. Plavix discontinued.     RN reports sheath out ~6:45pm.  Site without bleeding or hematoma.   Last heparin level was supratherapeutic (0.87) on heparin at 1000 units/hr ~11am today.  Drip turned off at 12noon for cath.  Goal of Therapy:  INR 2-3 Heparin level 0.3-0.7 units/ml Monitor platelets by anticoagulation protocol: Yes   Plan:   Resume heparin drip at ~2:45am at 850 units/hr.  Heparin level ~ 6 hrs after drip resumes.  Daily heparin level, CBC, PT/INR.  Coumadin to resume on 11/22.  Dennie Fettersgan, Estefany Goebel Donovan, [place] Pager: [phone] 01/29/2016,7:22 PM

## 2016-01-29 NOTE — Care Management Note (Addendum)
Case Management Note  Patient Details  Name: Erik BlacksmithHany Simpson MRN: [id number] Date of Birth: 08/24/1970  Subjective/Objective:  S/p left heart, STEMI , will be on brilinta, co pay is $50.00, NCM gave patient 30 day savings card. , [facility] [address] has in stock, NCM will cont to follow for dc needs.                  Action/Plan:   Expected Discharge Date:                  Expected Discharge Plan:  Home/Self Care  In-House Referral:     Discharge planning Services  CM Consult  Post Acute Care Choice:    Choice offered to:     DME Arranged:    DME Agency:     HH Arranged:    HH Agency:     Status of Service:  In process, will continue to follow  If discussed at Long Length of Stay Meetings, dates discussed:    Additional Comments:  Leone Havenaylor, Taiten Brawn [place], RN 01/29/2016, 4:15 PM

## 2016-01-29 NOTE — H&P (View-Only) (Signed)
Ref: No PCP Per Patient   Subjective:  Had chest pain 2-3 hours ago. Now chest pain free. INR remains elevated. Finished Aggrastat infusion.  Objective:  Vital Signs in the last 24 hours: Temp:  [98.4 F (36.9 C)-99.6 F (37.6 C)] 99.6 F (37.6 C) (11/20 1218) Pulse Rate:  [55-122] 95 (11/20 1400) Cardiac Rhythm: Atrial fibrillation (11/20 0800) Resp:  [10-21] 19 (11/20 1400) BP: (97-149)/(59-99) 101/79 (11/20 1400) SpO2:  [97 %-100 %] 98 % (11/20 1400) Weight:  [75.3 kg (166 lb 0.1 oz)-76.1 kg (167 lb 11.2 oz)] 75.3 kg (166 lb 0.1 oz) (11/19 2330)  Physical Exam: BP Readings from Last 1 Encounters:  01/28/16 101/79    Wt Readings from Last 1 Encounters:  01/27/16 75.3 kg (166 lb 0.1 oz)    Weight change:  Body mass index is 23.15 kg/m. HEENT: [place]/AT, Eyes-Brown, PERL, EOMI, Conjunctiva-Pink, Sclera-Non-icteric Neck: No JVD, No bruit, Trachea midline. Lungs:  Clear, Bilateral. Cardiac:  Irregular rhythm, normal S1 and S2, no S3. II/VI systolic murmur. Abdomen:  Soft, non-tender. BS present. Extremities:  No edema present. No cyanosis. No clubbing. CNS: AxOx3, Cranial nerves grossly intact, moves all 4 extremities.  Skin: Warm and dry.   Intake/Output from previous day: 11/19 0701 - 11/20 0700 In: 332.6 [P.O.:90; I.V.:242.6] Out: 425 [Urine:425]    Lab Results: BMET    Component Value Date/Time   NA 136 01/28/2016 0503   NA 138 01/27/2016 2324   NA 138 01/27/2016 1747   K 3.8 01/28/2016 0503   K 4.0 01/27/2016 2324   K 3.8 01/27/2016 1747   CL 104 01/28/2016 0503   CL 104 01/27/2016 2324   CL 105 01/27/2016 1747   CO2 24 01/28/2016 0503   CO2 25 01/27/2016 2324   CO2 23 01/27/2016 1747   GLUCOSE 111 (H) 01/28/2016 0503   GLUCOSE 123 (H) 01/27/2016 2324   GLUCOSE 125 (H) 01/27/2016 1747   BUN 6 01/28/2016 0503   BUN 6 01/27/2016 2324   BUN 7 01/27/2016 1747   CREATININE 0.68 01/28/2016 0503   CREATININE 0.90 01/27/2016 2324   CREATININE 0.77 01/27/2016  1747   CALCIUM 8.8 (L) 01/28/2016 0503   CALCIUM 9.2 01/27/2016 2324   CALCIUM 9.3 01/27/2016 1747   GFRNONAA >60 01/28/2016 0503   GFRNONAA >60 01/27/2016 2324   GFRNONAA >60 01/27/2016 1747   GFRAA >60 01/28/2016 0503   GFRAA >60 01/27/2016 2324   GFRAA >60 01/27/2016 1747   CBC    Component Value Date/Time   WBC 7.3 01/28/2016 0503   RBC 4.63 01/28/2016 0503   HGB 12.8 (L) 01/28/2016 0503   HCT 39.0 01/28/2016 0503   PLT 142 (L) 01/28/2016 0503   MCV 84.2 01/28/2016 0503   MCH 27.6 01/28/2016 0503   MCHC 32.8 01/28/2016 0503   RDW 14.7 01/28/2016 0503   LYMPHSABS 1.1 01/27/2016 2324   MONOABS 0.4 01/27/2016 2324   EOSABS 0.0 01/27/2016 2324   BASOSABS 0.0 01/27/2016 2324   HEPATIC Function Panel  Recent Labs  01/27/16 2324  PROT 7.2   HEMOGLOBIN A1C No components found for: HGA1C,  MPG CARDIAC ENZYMES Lab Results  Component Value Date   TROPONINI 46.39 (HH) 01/28/2016   TROPONINI 37.25 (HH) 01/28/2016   TROPONINI 16.10 (HH) 01/27/2016   BNP No results for input(s): PROBNP in the last 8760 hours. TSH  Recent Labs  01/27/16 2324  TSH 0.485   CHOLESTEROL  Recent Labs  01/28/16 0503  CHOL 114    Scheduled   Meds: . aspirin EC  81 mg Oral Daily  . atorvastatin  80 mg Oral q1800  . clopidogrel  75 mg Oral Q breakfast  . metoprolol tartrate  12.5 mg Oral BID  . pantoprazole  40 mg Oral Q0600  . phytonadione  5 mg Oral Once   Continuous Infusions: . sodium chloride    . nitroGLYCERIN 45 mcg/min (01/28/16 1400)   PRN Meds:.acetaminophen, nitroGLYCERIN, ondansetron (ZOFRAN) IV  Assessment/Plan: Acute MI CAD Atrial fibrillation with moderate ventricular response CHA2DSVASc score of 0 S/P Mitral valve repair Hyperlipidemia  Small dose vitamin K and anticoagulation with IV heparin. Cardiac cath in AM or today if symptoms return.   LOS: 1 day    Orpah CobbAjay Caroll Weinheimer  MD  01/28/2016, 3:46 PM

## 2016-01-29 NOTE — Interval H&P Note (Signed)
History and Physical Interval Note:  01/29/2016 12:30 PM  Erik Simpson  has presented today for surgery, with the diagnosis of mi  The various methods of treatment have been discussed with the patient and family. After consideration of risks, benefits and other options for treatment, the patient has consented to  Procedure(s): Left Heart Cath and Coronary Angiography (N/A) as a surgical intervention .  The patient's history has been reviewed, patient examined, no change in status, stable for surgery.  I have reviewed the patient's chart and labs.  Questions were answered to the patient's satisfaction.     Nicolae Vasek S

## 2016-01-30 ENCOUNTER — Encounter (HOSPITAL_COMMUNITY): Payer: Self-pay | Admitting: Cardiovascular Disease

## 2016-01-30 LAB — PROTIME-INR
INR: 1.3
Prothrombin Time: 16.3 seconds — ABNORMAL HIGH (ref 11.4–15.2)

## 2016-01-30 LAB — BASIC METABOLIC PANEL
ANION GAP: 11 (ref 5–15)
BUN: 5 mg/dL — ABNORMAL LOW (ref 6–20)
CHLORIDE: 103 mmol/L (ref 101–111)
CO2: 23 mmol/L (ref 22–32)
Calcium: 8.7 mg/dL — ABNORMAL LOW (ref 8.9–10.3)
Creatinine, Ser: 0.82 mg/dL (ref 0.61–1.24)
GFR calc non Af Amer: >60 mL/min (ref >60)
GLUCOSE: 104 mg/dL — AB (ref 65–99)
Potassium: 3.4 mmol/L — ABNORMAL LOW (ref 3.5–5.1)
Sodium: 137 mmol/L (ref 135–145)

## 2016-01-30 LAB — CBC
HCT: 38.4 % — ABNORMAL LOW (ref 39.0–52.0)
HEMOGLOBIN: 12.7 g/dL — AB (ref 13.0–17.0)
MCH: 28 pg (ref 26.0–34.0)
MCHC: 33.1 g/dL (ref 30.0–36.0)
MCV: 84.8 fL (ref 78.0–100.0)
Platelets: 151 10*3/uL (ref 150–400)
RBC: 4.53 MIL/uL (ref 4.22–5.81)
RDW: 14.6 % (ref 11.5–15.5)
WBC: 7.5 10*3/uL (ref 4.0–10.5)

## 2016-01-30 LAB — URINALYSIS, ROUTINE W REFLEX MICROSCOPIC
Bilirubin Urine: NEGATIVE
GLUCOSE, UA: NEGATIVE mg/dL
Hgb urine dipstick: NEGATIVE
KETONES UR: 15 mg/dL — AB
LEUKOCYTES UA: NEGATIVE
NITRITE: NEGATIVE
PROTEIN: NEGATIVE mg/dL
Specific Gravity, Urine: 1.021 (ref 1.005–1.030)
pH: 6 (ref 5.0–8.0)

## 2016-01-30 LAB — HEPARIN LEVEL (UNFRACTIONATED)
HEPARIN UNFRACTIONATED: 0.41 [IU]/mL (ref 0.30–0.70)
HEPARIN UNFRACTIONATED: 0.52 [IU]/mL (ref 0.30–0.70)

## 2016-01-30 MED ORDER — WARFARIN SODIUM 5 MG PO TABS
5.0000 mg | ORAL_TABLET | Freq: Once | ORAL | Status: AC
  Administered 2016-01-30: 5 mg via ORAL
  Filled 2016-01-30: qty 1

## 2016-01-30 MED ORDER — POTASSIUM CHLORIDE CRYS ER 10 MEQ PO TBCR
10.0000 meq | EXTENDED_RELEASE_TABLET | Freq: Two times a day (BID) | ORAL | Status: DC
  Administered 2016-01-30 – 2016-02-01 (×5): 10 meq via ORAL
  Filled 2016-01-30 (×10): qty 1

## 2016-01-30 MED ORDER — ANGIOPLASTY BOOK
Freq: Once | Status: AC
  Administered 2016-01-30: 02:00:00 1
  Filled 2016-01-30: qty 1

## 2016-01-30 MED ORDER — HEART ATTACK BOUNCING BOOK
Freq: Once | Status: AC
  Administered 2016-01-30: 06:00:00 1
  Filled 2016-01-30: qty 1

## 2016-01-30 MED ORDER — WARFARIN - PHYSICIAN DOSING INPATIENT
Freq: Every day | Status: DC
  Administered 2016-01-30: 17:00:00

## 2016-01-30 MED ORDER — CEFAZOLIN IN D5W 1 GM/50ML IV SOLN
1.0000 g | Freq: Three times a day (TID) | INTRAVENOUS | Status: AC
  Administered 2016-01-30 – 2016-01-31 (×3): 1 g via INTRAVENOUS
  Filled 2016-01-30 (×3): qty 50

## 2016-01-30 NOTE — Progress Notes (Signed)
ANTICOAGULATION CONSULT NOTE - FOLLOW UP  Pharmacy Consult:  Heparin Indication: atrial fibrillation  No Known Allergies  Patient Measurements: Height: 5\' 11"  (180.3 cm) Weight: 166 lb 10.7 oz (75.6 kg) IBW/kg (Calculated) : 75.3  Vital Signs: Temp: 99.6 F (37.6 C) (11/22 1510) Temp Source: Oral (11/22 1510) BP: 140/103 (11/22 1510) Pulse Rate: 90 (11/22 1510)  Labs:  Recent Labs  01/27/16 2324 01/28/16 0503 01/28/16 1233  01/29/16 0239 01/29/16 1113 01/30/16 0602 01/30/16 0956 01/30/16 1704  HGB 13.7 12.8*  --   --  12.7*  --  12.7*  --   --   HCT 41.0 39.0  --   --  37.5*  --  38.4*  --   --   PLT 151 142*  --   --  148*  --  151  --   --   LABPROT 23.7* 23.9*  --   --  20.4*  --  16.3*  --   --   INR 2.08 2.10  --   --  1.73  --  1.30  --   --   HEPARINUNFRC  --   --   --   < > 0.61 0.87*  --  0.41 0.52  CREATININE 0.90 0.68  --   --   --   --  0.82  --   --   TROPONINI 16.10* 37.25* 46.39*  --   --   --   --   --   --   < > = values in this interval not displayed.  Estimated Creatinine Clearance: 118.6 mL/min (by C-G formula based on SCr of 0.82 mg/dL).   Assessment: 45 YOM on Coumadin PTA for history of Afib, admitted with NSTEMI. Coumadin was reversed and IV heparin initiated on 01/28/16. Heparin resumed post-cath on 11/21, warfarin to resume 11/22.Heparin level remains therapeutic on 850 units/h. CBC stable, no bleed documented.  Goal of Therapy:  Heparin level 0.3-0.7 units/ml  INR 2-3 Monitor platelets by anticoagulation protocol: Yes    Plan:  - Continue Heparin at 850 units/h - d/c when INR therapeutic - Daily heparin level/INR/CBC - Monitor for s/sx bleeding   Toys 'R' UsKimberly Kathaleen Dudziak, Pharm.D., BCPS Clinical Pharmacist Pager [phone] 01/30/2016 7:38 PM

## 2016-01-30 NOTE — Progress Notes (Signed)
CARDIAC REHAB PHASE I   PRE:  Rate/Rhythm: 90 a fib  BP:  Sitting: 159/82        SaO2: 99 RA  MODE:  Ambulation: 500 ft   POST:  Rate/Rhythm: 140 a fib  BP:  Sitting: 175/87         SaO2: 100 RA  Pt ambulated 500 ft on RA, IV, independent, steady gait, tolerated well with no complaints. Pt HR elevated 140s. Completed MI/stent education with pt and wife at bedside.  Reviewed risk factors, MI book, anti-platelet therapy, stent card, activity restrictions, ntg, exercise, heart healthy diet, and phase 2 cardiac rehab. Pt verbalized understanding, receptive to education. Pt agrees to phase 2 cardiac rehab referral, [facility]. Pt to bed per pt request after walk, call bell within reach.   [phone] Joylene GrapesEmily C Elizabet Schweppe, RN, BSN 01/30/2016 10:20 AM

## 2016-01-30 NOTE — Progress Notes (Signed)
Mardene Sayerora Greenlee sent to Brent Bullaeborah C Masson Nalepa, RN        [facility] # [phone]   BRILINTA  90 MG  BID 30/60 TAB   COVER- YES  CO-PAY- $ 50.00  PRIOR APPROVAL- NO  PHARMACY : [facility]   MAIL-ORDER 90 DAY SUPPLY $ 100.00

## 2016-01-30 NOTE — Progress Notes (Addendum)
ANTICOAGULATION CONSULT NOTE - FOLLOW UP  Pharmacy Consult:  Heparin/Warfarin Indication: chest pain/ACS  No Known Allergies  Patient Measurements: Height: 5\' 11"  (180.3 cm) Weight: 166 lb 10.7 oz (75.6 kg) IBW/kg (Calculated) : 75.3  Vital Signs: Temp: 98.7 F (37.1 C) (11/22 0753) Temp Source: Oral (11/22 0753) BP: 128/76 (11/22 0753) Pulse Rate: 96 (11/22 0753)  Labs:  Recent Labs  01/27/16 2324 01/28/16 0503 01/28/16 1233 01/29/16 0239 01/29/16 1113 01/30/16 0602 01/30/16 0956  HGB 13.7 12.8*  --  12.7*  --  12.7*  --   HCT 41.0 39.0  --  37.5*  --  38.4*  --   PLT 151 142*  --  148*  --  151  --   LABPROT 23.7* 23.9*  --  20.4*  --  16.3*  --   INR 2.08 2.10  --  1.73  --  1.30  --   HEPARINUNFRC  --   --   --  0.61 0.87*  --  0.41  CREATININE 0.90 0.68  --   --   --  0.82  --   TROPONINI 16.10* 37.25* 46.39*  --   --   --   --     Estimated Creatinine Clearance: 118.6 mL/min (by C-G formula based on SCr of 0.82 mg/dL).   Assessment: 45 YOM on Coumadin PTA for history of Afib, admitted with NSTEMI. Coumadin was reversed and IV heparin initiated on 01/28/16. Heparin resumed post-cath on 11/21, warfarin to resume 11/22. MD ordered warfarin per Rx but placed order for 5mg  dose today. Heparin level therapeutic (0.41) on 850 units/h. INR 1.33. CBC stable, no bleed documented.  Goal of Therapy:  Heparin level 0.3-0.7 units/ml  INR 2-3 Monitor platelets by anticoagulation protocol: Yes    Plan:  - Heparin at 850 units/h - d/c when INR therapeutic - Warfarin 5mg  PO x 1 dose tonight - ordered per MD - 6h heparin level to confirm - Daily heparin level/INR/CBC - Monitor for s/sx bleeding   Babs BertinHaley Ronald Vinsant, PharmD, BCPS Clinical Pharmacist 01/30/2016 10:23 AM

## 2016-01-31 LAB — BASIC METABOLIC PANEL
ANION GAP: 10 (ref 5–15)
BUN: 8 mg/dL (ref 6–20)
CALCIUM: 8.9 mg/dL (ref 8.9–10.3)
CO2: 22 mmol/L (ref 22–32)
Chloride: 104 mmol/L (ref 101–111)
Creatinine, Ser: 0.91 mg/dL (ref 0.61–1.24)
Glucose, Bld: 111 mg/dL — ABNORMAL HIGH (ref 65–99)
Potassium: 4 mmol/L (ref 3.5–5.1)
Sodium: 136 mmol/L (ref 135–145)

## 2016-01-31 LAB — CBC
HEMATOCRIT: 37.9 % — AB (ref 39.0–52.0)
HEMOGLOBIN: 12.6 g/dL — AB (ref 13.0–17.0)
MCH: 28.2 pg (ref 26.0–34.0)
MCHC: 33.2 g/dL (ref 30.0–36.0)
MCV: 84.8 fL (ref 78.0–100.0)
Platelets: 151 10*3/uL (ref 150–400)
RBC: 4.47 MIL/uL (ref 4.22–5.81)
RDW: 14.3 % (ref 11.5–15.5)
WBC: 7.1 10*3/uL (ref 4.0–10.5)

## 2016-01-31 LAB — PROTIME-INR
INR: 1.23
Prothrombin Time: 15.6 seconds — ABNORMAL HIGH (ref 11.4–15.2)

## 2016-01-31 LAB — HEPARIN LEVEL (UNFRACTIONATED): HEPARIN UNFRACTIONATED: 0.45 [IU]/mL (ref 0.30–0.70)

## 2016-01-31 MED ORDER — WARFARIN SODIUM 5 MG PO TABS
5.0000 mg | ORAL_TABLET | Freq: Once | ORAL | Status: AC
  Administered 2016-01-31: 5 mg via ORAL
  Filled 2016-01-31: qty 1

## 2016-01-31 MED ORDER — WARFARIN - PHARMACIST DOSING INPATIENT
Freq: Every day | Status: DC
  Administered 2016-01-31: 18:00:00

## 2016-01-31 NOTE — Progress Notes (Signed)
ANTICOAGULATION CONSULT NOTE - FOLLOW UP  Pharmacy Consult:  Heparin/Warfarin Indication: atrial fibrillation  No Known Allergies  Patient Measurements: Height: 5\' 11"  (180.3 cm) Weight: 167 lb 15.9 oz (76.2 kg) IBW/kg (Calculated) : 75.3  Vital Signs: Temp: 98.5 F (36.9 C) (11/23 0631) Temp Source: Oral (11/23 0631) BP: 108/61 (11/23 0631) Pulse Rate: 89 (11/23 0631)  Labs:  Recent Labs  01/28/16 1233  01/29/16 0239  01/30/16 0602 01/30/16 0956 01/30/16 1704 01/31/16 0226  HGB  --   < > 12.7*  --  12.7*  --   --  12.6*  HCT  --   --  37.5*  --  38.4*  --   --  37.9*  PLT  --   --  148*  --  151  --   --  151  LABPROT  --   --  20.4*  --  16.3*  --   --  15.6*  INR  --   --  1.73  --  1.30  --   --  1.23  HEPARINUNFRC  --   --  0.61  < >  --  0.41 0.52 0.45  CREATININE  --   --   --   --  0.82  --   --  0.91  TROPONINI 46.39*  --   --   --   --   --   --   --   < > = values in this interval not displayed.  Estimated Creatinine Clearance: 106.9 mL/min (by C-G formula based on SCr of 0.91 mg/dL).   Assessment: 45 YOM on Coumadin PTA for history of Afib, admitted with NSTEMI. Coumadin was reversed and IV heparin initiated on 01/28/16. Heparin resumed post-cath on 11/21 and  warfarin resumed 11/22. Heparin level remains therapeutic at 0.45 on 850 units/h. CBC stable. No bleeding was noted.  INR remains subtherapeutic this morning at 1.23 after 1 dose of warfarin  5 mg. Will expect to see full effect of dose tomorrow. Of note patient is also on aspirin and ticagrelor post-cath as well as amiodarone which can increase the risk of bleeding when given concomitantly with warfarin. Will monitor.   PTA warfarin dose: 2.5 mg daily  Goal of Therapy:  Heparin level 0.3-0.7 units/ml  INR 2-3 Monitor platelets by anticoagulation protocol: Yes    Plan:  - Continue Heparin at 850 units/h - d/c when INR therapeutic - Repeat warfarin 5 mg X 1 tonight  - Daily heparin  level/INR/CBC - Monitor for s/sx bleeding   Bailey MechEmily Stewart, PharmD PGY1 Pharmacy Resident  Pager [phone] 01/31/2016 7:46 AM

## 2016-01-31 NOTE — Progress Notes (Signed)
Late entry Ref: No PCP Per Patient   Subjective:  Feeling better.  Objective:  Vital Signs in the last 24 hours: Temp:  [98.5 F (36.9 C)-99.6 F (37.6 C)] 98.5 F (36.9 C) (11/23 0819) Pulse Rate:  [82-94] 87 (11/23 0819) Cardiac Rhythm: Atrial fibrillation (11/23 0900) Resp:  [15-21] 20 (11/23 0819) BP: (99-140)/(59-103) 113/78 (11/23 0819) SpO2:  [99 %-100 %] 99 % (11/23 0819) Weight:  [76.2 kg (167 lb 15.9 oz)] 76.2 kg (167 lb 15.9 oz) (11/23 0631)  Physical Exam: BP Readings from Last 1 Encounters:  01/31/16 113/78    Wt Readings from Last 1 Encounters:  01/31/16 76.2 kg (167 lb 15.9 oz)    Weight change: 0.6 kg (1 lb 5.2 oz) Body mass index is 23.43 kg/m. HEENT: [place]/AT, Eyes-Brown, PERL, EOMI, Conjunctiva-Pink, Sclera-Non-icteric Neck: No JVD, No bruit, Trachea midline. Lungs:  Clear, Bilateral. Cardiac:  Regular rhythm, normal S1 and S2, no S3. II/VI systolic murmur. Abdomen:  Soft, non-tender. BS present. Extremities:  No edema present. No cyanosis. No clubbing. CNS: AxOx3, Cranial nerves grossly intact, moves all 4 extremities.  Skin: Warm and dry.   Intake/Output from previous day: 11/22 0701 - 11/23 0700 In: 845.5 [P.O.:600; I.V.:195.5; IV Piggyback:50] Out: 650 [Urine:650]    Lab Results: BMET    Component Value Date/Time   NA 136 01/31/2016 0226   NA 137 01/30/2016 0602   NA 136 01/28/2016 0503   K 4.0 01/31/2016 0226   K 3.4 (L) 01/30/2016 0602   K 3.8 01/28/2016 0503   CL 104 01/31/2016 0226   CL 103 01/30/2016 0602   CL 104 01/28/2016 0503   CO2 22 01/31/2016 0226   CO2 23 01/30/2016 0602   CO2 24 01/28/2016 0503   GLUCOSE 111 (H) 01/31/2016 0226   GLUCOSE 104 (H) 01/30/2016 0602   GLUCOSE 111 (H) 01/28/2016 0503   BUN 8 01/31/2016 0226   BUN 5 (L) 01/30/2016 0602   BUN 6 01/28/2016 0503   CREATININE 0.91 01/31/2016 0226   CREATININE 0.82 01/30/2016 0602   CREATININE 0.68 01/28/2016 0503   CALCIUM 8.9 01/31/2016 0226   CALCIUM 8.7  (L) 01/30/2016 0602   CALCIUM 8.8 (L) 01/28/2016 0503   GFRNONAA >60 01/31/2016 0226   GFRNONAA >60 01/30/2016 0602   GFRNONAA >60 01/28/2016 0503   GFRAA >60 01/31/2016 0226   GFRAA >60 01/30/2016 0602   GFRAA >60 01/28/2016 0503   CBC    Component Value Date/Time   WBC 7.1 01/31/2016 0226   RBC 4.47 01/31/2016 0226   HGB 12.6 (L) 01/31/2016 0226   HCT 37.9 (L) 01/31/2016 0226   PLT 151 01/31/2016 0226   MCV 84.8 01/31/2016 0226   MCH 28.2 01/31/2016 0226   MCHC 33.2 01/31/2016 0226   RDW 14.3 01/31/2016 0226   LYMPHSABS 1.1 01/27/2016 2324   MONOABS 0.4 01/27/2016 2324   EOSABS 0.0 01/27/2016 2324   BASOSABS 0.0 01/27/2016 2324   HEPATIC Function Panel  Recent Labs  01/27/16 2324  PROT 7.2   HEMOGLOBIN A1C No components found for: HGA1C,  MPG CARDIAC ENZYMES Lab Results  Component Value Date   TROPONINI 46.39 (HH) 01/28/2016   TROPONINI 37.25 (HH) 01/28/2016   TROPONINI 16.10 (HH) 01/27/2016   BNP No results for input(s): PROBNP in the last 8760 hours. TSH  Recent Labs  01/27/16 2324  TSH 0.485   CHOLESTEROL  Recent Labs  01/28/16 0503  CHOL 114    Scheduled Meds: . amiodarone  200 mg Oral  BID  . aspirin EC  81 mg Oral Daily  . atorvastatin  80 mg Oral q1800  . metoprolol tartrate  50 mg Oral BID  . pantoprazole  40 mg Oral Q0600  . potassium chloride  10 mEq Oral BID  . sodium chloride flush  3 mL Intravenous Q12H  . ticagrelor  90 mg Oral BID  . warfarin  5 mg Oral ONCE-1800  . Warfarin - Pharmacist Dosing Inpatient   Does not apply q1800   Continuous Infusions: . sodium chloride    . heparin 850 Units/hr (01/31/16 0500)  . nitroGLYCERIN Stopped (01/29/16 1309)   PRN Meds:.sodium chloride, acetaminophen, nitroGLYCERIN, ondansetron (ZOFRAN) IV, sodium chloride flush  Assessment/Plan: Acute anterior wall MI S/P LAD stent Atrial fibrillation, chronic S/P MV repair Hyperlipidemia  Continue medical treatment. Resume coumadin.     LOS: 4 days    Orpah CobbAjay Demtrius Rounds  MD  01/31/2016, 10:20 AM

## 2016-01-31 NOTE — Progress Notes (Addendum)
Ref: No PCP Per Patient   Subjective:  Feeling better. T max 99.6 degree F. INR 1.23  Objective:  Vital Signs in the last 24 hours: Temp:  [98.5 F (36.9 C)-99.6 F (37.6 C)] 98.5 F (36.9 C) (11/23 0819) Pulse Rate:  [82-94] 87 (11/23 0819) Cardiac Rhythm: Atrial fibrillation (11/23 0900) Resp:  [15-21] 20 (11/23 0819) BP: (99-140)/(59-103) 113/78 (11/23 0819) SpO2:  [99 %-100 %] 99 % (11/23 0819) Weight:  [76.2 kg (167 lb 15.9 oz)] 76.2 kg (167 lb 15.9 oz) (11/23 0631)  Physical Exam: BP Readings from Last 1 Encounters:  01/31/16 113/78    Wt Readings from Last 1 Encounters:  01/31/16 76.2 kg (167 lb 15.9 oz)    Weight change: 0.6 kg (1 lb 5.2 oz) Body mass index is 23.43 kg/m. HEENT: [place]/AT, Eyes-Brown, PERL, EOMI, Conjunctiva-Pink, Sclera-Non-icteric Neck: No JVD, No bruit, Trachea midline. Lungs:  Clear, Bilateral. Cardiac:  Regular rhythm, normal S1 and S2, no S3. II/VI systolic murmur. Abdomen:  Soft, non-tender. BS present. Extremities:  No edema present. No cyanosis. No clubbing. No hematoma. CNS: AxOx3, Cranial nerves grossly intact, moves all 4 extremities.  Skin: Warm and dry.   Intake/Output from previous day: 11/22 0701 - 11/23 0700 In: 845.5 [P.O.:600; I.V.:195.5; IV Piggyback:50] Out: 650 [Urine:650]    Lab Results: BMET    Component Value Date/Time   NA 136 01/31/2016 0226   NA 137 01/30/2016 0602   NA 136 01/28/2016 0503   K 4.0 01/31/2016 0226   K 3.4 (L) 01/30/2016 0602   K 3.8 01/28/2016 0503   CL 104 01/31/2016 0226   CL 103 01/30/2016 0602   CL 104 01/28/2016 0503   CO2 22 01/31/2016 0226   CO2 23 01/30/2016 0602   CO2 24 01/28/2016 0503   GLUCOSE 111 (H) 01/31/2016 0226   GLUCOSE 104 (H) 01/30/2016 0602   GLUCOSE 111 (H) 01/28/2016 0503   BUN 8 01/31/2016 0226   BUN 5 (L) 01/30/2016 0602   BUN 6 01/28/2016 0503   CREATININE 0.91 01/31/2016 0226   CREATININE 0.82 01/30/2016 0602   CREATININE 0.68 01/28/2016 0503   CALCIUM 8.9  01/31/2016 0226   CALCIUM 8.7 (L) 01/30/2016 0602   CALCIUM 8.8 (L) 01/28/2016 0503   GFRNONAA >60 01/31/2016 0226   GFRNONAA >60 01/30/2016 0602   GFRNONAA >60 01/28/2016 0503   GFRAA >60 01/31/2016 0226   GFRAA >60 01/30/2016 0602   GFRAA >60 01/28/2016 0503   CBC    Component Value Date/Time   WBC 7.1 01/31/2016 0226   RBC 4.47 01/31/2016 0226   HGB 12.6 (L) 01/31/2016 0226   HCT 37.9 (L) 01/31/2016 0226   PLT 151 01/31/2016 0226   MCV 84.8 01/31/2016 0226   MCH 28.2 01/31/2016 0226   MCHC 33.2 01/31/2016 0226   RDW 14.3 01/31/2016 0226   LYMPHSABS 1.1 01/27/2016 2324   MONOABS 0.4 01/27/2016 2324   EOSABS 0.0 01/27/2016 2324   BASOSABS 0.0 01/27/2016 2324   HEPATIC Function Panel  Recent Labs  01/27/16 2324  PROT 7.2   HEMOGLOBIN A1C No components found for: HGA1C,  MPG CARDIAC ENZYMES Lab Results  Component Value Date   TROPONINI 46.39 (HH) 01/28/2016   TROPONINI 37.25 (HH) 01/28/2016   TROPONINI 16.10 (HH) 01/27/2016   BNP No results for input(s): PROBNP in the last 8760 hours. TSH  Recent Labs  01/27/16 2324  TSH 0.485   CHOLESTEROL  Recent Labs  01/28/16 0503  CHOL 114    Scheduled  Meds: . amiodarone  200 mg Oral BID  . aspirin EC  81 mg Oral Daily  . atorvastatin  80 mg Oral q1800  . metoprolol tartrate  50 mg Oral BID  . pantoprazole  40 mg Oral Q0600  . potassium chloride  10 mEq Oral BID  . sodium chloride flush  3 mL Intravenous Q12H  . ticagrelor  90 mg Oral BID  . warfarin  5 mg Oral ONCE-1800  . Warfarin - Pharmacist Dosing Inpatient   Does not apply q1800   Continuous Infusions: . sodium chloride    . heparin 850 Units/hr (01/31/16 0500)  . nitroGLYCERIN Stopped (01/29/16 1309)   PRN Meds:.sodium chloride, acetaminophen, nitroGLYCERIN, ondansetron (ZOFRAN) IV, sodium chloride flush  Assessment/Plan: Acute anterior wall MI Multivessel CAD S/P LAD stent Chronic Atrial fibrillation, CHA2DS2VASc score 1 S/P Mitral valve  repair Hyperlipidemia Chronic coumadin use  Continue IV heparin and PO coumadin. Transfer to Telemetry bed. Increase activity.   LOS: 4 days    Orpah CobbAjay Simeon Vera  MD  01/31/2016, 10:06 AM

## 2016-02-01 LAB — CBC
HCT: 39 % (ref 39.0–52.0)
Hemoglobin: 13 g/dL (ref 13.0–17.0)
MCH: 28.3 pg (ref 26.0–34.0)
MCHC: 33.3 g/dL (ref 30.0–36.0)
MCV: 84.8 fL (ref 78.0–100.0)
Platelets: 199 10*3/uL (ref 150–400)
RBC: 4.6 MIL/uL (ref 4.22–5.81)
RDW: 14.2 % (ref 11.5–15.5)
WBC: 6.9 10*3/uL (ref 4.0–10.5)

## 2016-02-01 LAB — HEPARIN LEVEL (UNFRACTIONATED): Heparin Unfractionated: 0.51 IU/mL (ref 0.30–0.70)

## 2016-02-01 LAB — PROTIME-INR
INR: 1.34
PROTHROMBIN TIME: 16.6 s — AB (ref 11.4–15.2)

## 2016-02-01 MED ORDER — METOPROLOL TARTRATE 50 MG PO TABS: 50.0000 mg | ORAL_TABLET | Freq: Two times a day (BID) | ORAL | 6 refills | Status: DC

## 2016-02-01 MED ORDER — WARFARIN SODIUM 5 MG PO TABS: 5.0000 mg | ORAL_TABLET | Freq: Once | ORAL | Status: DC

## 2016-02-01 MED ORDER — ENOXAPARIN SODIUM 80 MG/0.8ML ~~LOC~~ SOLN: 70.0000 mg | Freq: Two times a day (BID) | SUBCUTANEOUS | 1 refills | Status: DC

## 2016-02-01 MED ORDER — NITROGLYCERIN 0.4 MG SL SUBL: 0.4000 mg | SUBLINGUAL_TABLET | SUBLINGUAL | 1 refills | Status: AC | PRN

## 2016-02-01 MED ORDER — AMIODARONE HCL 200 MG PO TABS: 200.0000 mg | ORAL_TABLET | Freq: Every day | ORAL | 3 refills | Status: AC

## 2016-02-01 MED ORDER — ENOXAPARIN SODIUM 80 MG/0.8ML ~~LOC~~ SOLN
70.0000 mg | Freq: Two times a day (BID) | SUBCUTANEOUS | Status: DC
  Administered 2016-02-01: 70 mg via SUBCUTANEOUS
  Filled 2016-02-01: qty 0.8

## 2016-02-01 MED ORDER — PANTOPRAZOLE SODIUM 40 MG PO TBEC: 40.0000 mg | DELAYED_RELEASE_TABLET | Freq: Every day | ORAL | 1 refills | Status: DC

## 2016-02-01 MED ORDER — TICAGRELOR 90 MG PO TABS: 90.0000 mg | ORAL_TABLET | Freq: Two times a day (BID) | ORAL | 3 refills | Status: DC

## 2016-02-01 MED ORDER — ASPIRIN 81 MG PO TBEC: 81.0000 mg | DELAYED_RELEASE_TABLET | Freq: Every day | ORAL | Status: DC

## 2016-02-01 MED ORDER — ATORVASTATIN CALCIUM 80 MG PO TABS: 80.0000 mg | ORAL_TABLET | Freq: Every day | ORAL | 3 refills | Status: AC

## 2016-02-01 MED ORDER — LISINOPRIL 2.5 MG PO TABS: 2.5000 mg | ORAL_TABLET | Freq: Every day | ORAL | 3 refills | Status: DC

## 2016-02-01 NOTE — Discharge Summary (Signed)
Physician Discharge Summary  Patient ID: Erik BlacksmithHany Simpson MRN: [id number] DOB/AGE: 47/10/1970 47 y.o.  Admit date: 01/27/2016 Discharge date: 02/01/2016  Admission Diagnoses:  Discharge Diagnoses:  Principle Problem: * Acute antero-lateral wall MI * Active Problems:   Multivessel CAD   Simpson/P LAD stent   Chronic atrial fibrillaiton, CHA2DSVASc score of 1/9   Simpson/P Mitral valve repair   Hyperlipidemia   Chronic coumadin use  Discharged Condition: fair  Hospital Course: 45 year old male with past medical history of rheumatic heart disease, atrial fibrillation, hyperlipidemia, Simpson/P MVR in 1990 followed [place] presented with chest pain radiating to right arm with nausea and vomiting. Small dose of vitamin K was used to drop INR to 1.7 or below and coronary angiogram was done followed by LAD PTCA by Dr. Sharyn LullHarwani. Post procedure IV heparin followed by bridging by SQ Lovenox was done as PO warfarin was resumed. Patient will have follow up and INR rechecked in 3 days at my office.   Consults: cardiology  Significant Diagnostic Studies: labs: Normal CBC with borderline low platelets count of 146K. Normal BMET with mild hyperglycemia. Lipid panel was normal with LDL level of 54 mg/dL. Troponin-I peaked at 46.39 ng.  Atrial fibrillation with acute antero-lateral wall MI.  Chest x-ray: vascular congestion.  Cardiac cath : 99 % proximal to mid LAD stenosis treated with 2.75 x 18 mm DES to 0 %.  Treatments: cardiac meds: lisinopril (generic), metoprolol, amiodarone, aspirin, atorvastatin and Ticagrelor.  Discharge Exam: Blood pressure 113/77, pulse 91, temperature 98.9 F (37.2 C), temperature source Oral, resp. rate 16, height 5\' 11"  (1.803 m), weight 72.8 kg (160 lb 6.4 oz), SpO2 100 %. General appearance: alert, cooperative, appears stated age and no distress Head: Normocephalic, atraumatic Eyes: Brown eyes, pink conjunctivae/corneas clear. PERRL, EOM'Simpson intact.   Neck: No adenopathy, no carotid bruit, no JVD, supple, symmetrical, trachea midline and thyroid not enlarged. Resp: Clear to auscultation bilaterally Cardio: Irregular rate and rhythm, S1, S2 normal, II/VI systolic murmur, no click, rub or gallop GI: soft, non-tender; bowel sounds normal; no masses,  no organomegaly Extremities: No cyanosis or edema. No right groin hematoma. Skin: Warm and dry. No rashes or lesions Neurologic: Alert and oriented X 3, normal strength and tone. Normal coordination and gait  Disposition: 01-Home or Self Care  Discharge Instructions    AMB Referral to Cardiac Rehabilitation - Phase II    Complete by:  As directed    Diagnosis:  Coronary Stents   Amb Referral to Cardiac Rehabilitation    Complete by:  As directed    Diagnosis:   Coronary Stents STEMI         Medication List    TAKE these medications   amiodarone 200 MG tablet Commonly known as:  PACERONE Take 1 tablet (200 mg total) by mouth daily.   aspirin 81 MG EC tablet Take 1 tablet (81 mg total) by mouth daily. Start taking on:  02/02/2016   atorvastatin 80 MG tablet Commonly known as:  LIPITOR Take 1 tablet (80 mg total) by mouth daily at 6 PM. What changed:  medication strength  how much to take  when to take this   enoxaparin 80 MG/0.8ML injection Commonly known as:  LOVENOX Inject 0.7 mLs (70 mg total) into the skin every 12 (twelve) hours.   lisinopril 2.5 MG tablet Commonly known as:  PRINIVIL,ZESTRIL Take 1 tablet (2.5 mg total) by mouth daily.   metoprolol 50 MG tablet Commonly known  as:  LOPRESSOR Take 1 tablet (50 mg total) by mouth 2 (two) times daily.   nitroGLYCERIN 0.4 MG SL tablet Commonly known as:  NITROSTAT Place 1 tablet (0.4 mg total) under the tongue every 5 (five) minutes as needed for chest pain.   pantoprazole 40 MG tablet Commonly known as:  PROTONIX Take 1 tablet (40 mg total) by mouth daily at 6 (six) AM. Start taking on:  02/02/2016    ticagrelor 90 MG Tabs tablet Commonly known as:  BRILINTA Take 1 tablet (90 mg total) by mouth 2 (two) times daily.   warfarin 2.5 MG tablet Commonly known as:  COUMADIN Take 2.5 mg by mouth daily.      Follow-up Information    Rosalita Carey S, MD Follow up in 3 day(Simpson).   Specialty:  Cardiology Contact information: [address] [place] [address] [phone]           Signed: Ricki RodriguezKADAKIA,Erik Simpson 02/01/2016, 11:56 AM

## 2016-02-01 NOTE — Progress Notes (Signed)
Pt discharged home. IV and tele removed. Taken out via wheelchair. discharge instructions given, questions answered. Taken out via wheelchair.

## 2016-02-01 NOTE — Progress Notes (Signed)
Pt states frustration with stay in the hospital and is ready to go home. Spoke to the patient about the need for heparin gtt and coumadin bridge. Wife at the bedside. States understanding. Will wait to see doctor.

## 2016-02-01 NOTE — Progress Notes (Signed)
Lovenox teach done with patient and family at the bedside. Questions answers

## 2016-02-01 NOTE — Discharge Instructions (Signed)

## 2016-02-01 NOTE — Progress Notes (Addendum)
After discussion with cardiology will change to lovenox bridge and d/c heparin for discharge this afternoon.   Lovenox 70mg  sq q12 hours - will likely need brige for ~5 days Plan is for INR check on Monday Would suggest 5mg  of warfarin for 1 more day then can go back to home dose. 02/01/2016 10:41 AM   ANTICOAGULATION CONSULT NOTE - FOLLOW UP  Pharmacy Consult:  Heparin/Warfarin Indication: atrial fibrillation  No Known Allergies  Patient Measurements: Height: 5\' 11"  (180.3 cm) Weight: 160 lb 6.4 oz (72.8 kg) (scale a) IBW/kg (Calculated) : 75.3  Vital Signs: Temp: 98.9 F (37.2 C) (11/24 0500) Temp Source: Oral (11/24 0500) BP: 117/77 (11/24 0500) Pulse Rate: 82 (11/24 0500)  Labs:  Recent Labs  01/30/16 0602  01/30/16 1704 01/31/16 0226 02/01/16 0317  HGB 12.7*  --   --  12.6* 13.0  HCT 38.4*  --   --  37.9* 39.0  PLT 151  --   --  151 199  LABPROT 16.3*  --   --  15.6* 16.6*  INR 1.30  --   --  1.23 1.34  HEPARINUNFRC  --   < > 0.52 0.45 0.51  CREATININE 0.82  --   --  0.91  --   < > = values in this interval not displayed.  Estimated Creatinine Clearance: 103.3 mL/min (by C-G formula based on SCr of 0.91 mg/dL).   Assessment: 45 YOM on Coumadin PTA for history of Afib, admitted with NSTEMI. Coumadin was reversed and IV heparin initiated on 01/28/16. Heparin resumed post-cath on 11/21 and  warfarin resumed 11/22.  Heparin level remains therapeutic at 0.5 on 850 units/h. CBC stable. No bleeding was noted.   INR remains subtherapeutic this morning at 1.3. Of note patient is also on aspirin and ticagrelor post-cath as well as amiodarone which can increase warfarin levels. Will monitor INR carefully.   PTA warfarin dose: 2.5 mg daily  Goal of Therapy:  Heparin level 0.3-0.7 units/ml  INR 2-3 Monitor platelets by anticoagulation protocol: Yes    Plan:  - Continue Heparin at 850 units/h - d/c when INR therapeutic - Repeat warfarin 5 mg X 1 tonight  -  Daily heparin level/INR/CBC - Monitor for s/sx bleeding  Sheppard CoilFrank Akeem Heppler PharmD., BCPS Clinical Pharmacist Pager [phone] 02/01/2016 9:12 AM

## 2016-02-02 LAB — HEMOGLOBIN A1C
Hgb A1c MFr Bld: 5.9 % — ABNORMAL HIGH (ref 4.8–5.6)
MEAN PLASMA GLUCOSE: 123 mg/dL

## 2016-02-04 LAB — CULTURE, BLOOD (ROUTINE X 2)
Culture: NO GROWTH
Culture: NO GROWTH

## 2016-03-19 ENCOUNTER — Encounter (HOSPITAL_COMMUNITY): Payer: Self-pay | Admitting: *Deleted

## 2016-03-19 ENCOUNTER — Emergency Department (HOSPITAL_COMMUNITY)
Admission: EM | Admit: 2016-03-19 | Discharge: 2016-03-19 | Disposition: A | Discharge status: Home / Self Care | Payer: BLUE CROSS/BLUE SHIELD | Attending: Emergency Medicine | Admitting: Emergency Medicine

## 2016-03-19 DIAGNOSIS — I2510 Atherosclerotic heart disease of native coronary artery without angina pectoris: Secondary | ICD-10-CM | POA: Diagnosis not present

## 2016-03-19 DIAGNOSIS — Z7982 Long term (current) use of aspirin: Secondary | ICD-10-CM | POA: Diagnosis not present

## 2016-03-19 DIAGNOSIS — K068 Other specified disorders of gingiva and edentulous alveolar ridge: Secondary | ICD-10-CM

## 2016-03-19 DIAGNOSIS — I252 Old myocardial infarction: Secondary | ICD-10-CM | POA: Diagnosis not present

## 2016-03-19 DIAGNOSIS — Z7901 Long term (current) use of anticoagulants: Secondary | ICD-10-CM | POA: Insufficient documentation

## 2016-03-19 LAB — COMPREHENSIVE METABOLIC PANEL
ALT: 48 U/L (ref 17–63)
ANION GAP: 8 (ref 5–15)
AST: 34 U/L (ref 15–41)
Albumin: 4 g/dL (ref 3.5–5.0)
Alkaline Phosphatase: 60 U/L (ref 38–126)
BUN: 30 mg/dL — ABNORMAL HIGH (ref 6–20)
CHLORIDE: 105 mmol/L (ref 101–111)
CO2: 25 mmol/L (ref 22–32)
Calcium: 9.5 mg/dL (ref 8.9–10.3)
Creatinine, Ser: 0.86 mg/dL (ref 0.61–1.24)
GFR calc non Af Amer: >60 mL/min (ref >60)
Glucose, Bld: 125 mg/dL — ABNORMAL HIGH (ref 65–99)
Potassium: 4.8 mmol/L (ref 3.5–5.1)
SODIUM: 138 mmol/L (ref 135–145)
Total Bilirubin: 0.7 mg/dL (ref 0.3–1.2)
Total Protein: 7.4 g/dL (ref 6.5–8.1)

## 2016-03-19 LAB — CBC WITH DIFFERENTIAL/PLATELET
BASOS PCT: 0 %
Basophils Absolute: 0 10*3/uL (ref 0.0–0.1)
EOS ABS: 0.1 10*3/uL (ref 0.0–0.7)
EOS PCT: 1 %
HCT: 37.4 % — ABNORMAL LOW (ref 39.0–52.0)
Hemoglobin: 11.9 g/dL — ABNORMAL LOW (ref 13.0–17.0)
LYMPHS ABS: 2.1 10*3/uL (ref 0.7–4.0)
Lymphocytes Relative: 21 %
MCH: 27.7 pg (ref 26.0–34.0)
MCHC: 31.8 g/dL (ref 30.0–36.0)
MCV: 87.2 fL (ref 78.0–100.0)
MONOS PCT: 7 %
Monocytes Absolute: 0.7 10*3/uL (ref 0.1–1.0)
Neutro Abs: 7 10*3/uL (ref 1.7–7.7)
Neutrophils Relative %: 71 %
PLATELETS: 235 10*3/uL (ref 150–400)
RBC: 4.29 MIL/uL (ref 4.22–5.81)
RDW: 15.5 % (ref 11.5–15.5)
WBC: 10 10*3/uL (ref 4.0–10.5)

## 2016-03-19 LAB — PROTIME-INR
INR: 2.52
Prothrombin Time: 27.6 seconds — ABNORMAL HIGH (ref 11.4–15.2)

## 2016-03-19 MED ORDER — FERROUS SULFATE 325 (65 FE) MG PO TABS
650.0000 mg | ORAL_TABLET | Freq: Once | ORAL | Status: AC
  Administered 2016-03-19: 650 mg via ORAL
  Filled 2016-03-19: qty 2

## 2016-03-19 MED ORDER — LIDOCAINE-EPINEPHRINE-TETRACAINE (LET) SOLUTION
3.0000 mL | Freq: Once | NASAL | Status: AC
  Administered 2016-03-19: 3 mL via TOPICAL
  Filled 2016-03-19: qty 3

## 2016-03-19 MED ORDER — ONDANSETRON 4 MG PO TBDP: 4.0000 mg | ORAL_TABLET | Freq: Three times a day (TID) | ORAL | 0 refills | Status: DC | PRN

## 2016-03-19 MED ORDER — LIDOCAINE-EPINEPHRINE-TETRACAINE (LET) SOLUTION: 6.0000 mL | Freq: Once | NASAL | Status: DC

## 2016-03-19 MED ORDER — VITAMIN K1 10 MG/ML IJ SOLN
5.0000 mg | Freq: Once | INTRAVENOUS | Status: AC
  Administered 2016-03-19: 5 mg via INTRAVENOUS
  Filled 2016-03-19: qty 0.5

## 2016-03-19 NOTE — ED Provider Notes (Signed)
[facility]EM[facility] DEPT Provider Note   CSN: [id number] Arrival date & time: 03/19/16  1027     History   Chief Complaint Chief Complaint  Patient presents with  . Bleeding/Bruising    HPI Erik Simpson is a 46 y.o. male.  The history is provided by the patient. No language interpreter was used.  Dental Pain   This is a new problem. The problem occurs constantly. The problem has been gradually worsening. He has tried nothing for the symptoms. The treatment provided no relief.  Pt saw the dentist 2 days ago for a deep dental cleaning.  Pt is on coumadin.  Pt reports bleeding has continued since.   Past Medical History:  Diagnosis Date  . Atrial fibrillation (HCC)   . Coronary artery disease   . Hypercholesteremia   . STEMI (ST elevation myocardial infarction) (HCC) 01/27/2016    Patient Active Problem List   Diagnosis Date Noted  . Acute non Q wave myocardial infarction (HCC) 01/27/2016    Past Surgical History:  Procedure Laterality Date  . CARDIAC CATHETERIZATION N/A 01/29/2016   Procedure: Left Heart Cath and Coronary Angiography;  Surgeon: Orpah CobbAjay Kadakia, MD;  [facility];  Service: Cardiovascular;  Laterality: N/A;  . CARDIAC CATHETERIZATION N/A 01/29/2016   Procedure: Coronary Stent Intervention;  Surgeon: Rinaldo CloudMohan Harwani, MD;  [facility]: [facility];  Service: Cardiovascular;  Laterality: N/A;  . CORONARY ANGIOPLASTY WITH STENT PLACEMENT  01/29/2016  . MITRAL VALVE REPAIR  1990s       Home Medications    Prior to Admission medications   Medication Sig Start Date End Date Taking? Authorizing Provider  amiodarone (PACERONE) 200 MG tablet Take 1 tablet (200 mg total) by mouth daily. 02/01/16  Yes Orpah CobbAjay Kadakia, MD  aspirin EC 81 MG EC tablet Take 1 tablet (81 mg total) by mouth daily. 02/02/16  Yes Orpah CobbAjay Kadakia, MD  atorvastatin (LIPITOR) 80 MG tablet Take 1 tablet (80 mg total) by mouth daily at 6 PM. 02/01/16  Yes Orpah CobbAjay Kadakia, MD  enoxaparin  (LOVENOX) 80 MG/0.8ML injection Inject 0.7 mLs (70 mg total) into the skin every 12 (twelve) hours. 02/01/16  Yes Orpah CobbAjay Kadakia, MD  lisinopril (PRINIVIL,ZESTRIL) 2.5 MG tablet Take 1 tablet (2.5 mg total) by mouth daily. Patient taking differently: Take 2.5 mg by mouth 2 (two) times daily.  02/01/16  Yes Orpah CobbAjay Kadakia, MD  metoprolol (LOPRESSOR) 50 MG tablet Take 1 tablet (50 mg total) by mouth 2 (two) times daily. Patient taking differently: Take 25 mg by mouth 2 (two) times daily.  02/01/16  Yes Orpah CobbAjay Kadakia, MD  nitroGLYCERIN (NITROSTAT) 0.4 MG SL tablet Place 1 tablet (0.4 mg total) under the tongue every 5 (five) minutes as needed for chest pain. 02/01/16  Yes Orpah CobbAjay Kadakia, MD  pantoprazole (PROTONIX) 40 MG tablet Take 1 tablet (40 mg total) by mouth daily at 6 (six) AM. 02/02/16  Yes Orpah CobbAjay Kadakia, MD  ticagrelor (BRILINTA) 90 MG TABS tablet Take 1 tablet (90 mg total) by mouth 2 (two) times daily. 02/01/16  Yes Orpah CobbAjay Kadakia, MD  warfarin (COUMADIN) 2.5 MG tablet Take 2.5 mg by mouth See admin instructions. Pt takes 2.5mg  once daily, except Monday and Tuesday   Yes Historical Provider, MD    Family History Family History  Problem Relation Age of Onset  . Coronary artery disease Mother     Social History Social History  Substance Use Topics  . Smoking status: Never Smoker  . Smokeless tobacco: Never Used  .  Alcohol use No     Allergies   Patient has no known allergies.   Review of Systems Review of Systems  All other systems reviewed and are negative.    Physical Exam Updated Vital Signs BP 104/70   Pulse 88   Temp 98.5 F (36.9 C) (Oral)   Resp 18   SpO2 100%   Physical Exam  Constitutional: He appears well-developed and well-nourished.  HENT:  Head: Normocephalic and atraumatic.  Bleeding inner and outer gumlines  continuous oozing.  Eyes: Conjunctivae are normal.  Neck: Neck supple.  Cardiovascular: Normal rate and regular rhythm.   No murmur  heard. Pulmonary/Chest: Effort normal and breath sounds normal. No respiratory distress.  Abdominal: Soft. There is no tenderness.  Musculoskeletal: He exhibits no edema.  Neurological: He is alert.  Skin: Skin is warm and dry.  Psychiatric: He has a normal mood and affect.  Nursing note and vitals reviewed.    ED Treatments / Results  Labs (all labs ordered are listed, but only abnormal results are displayed) Labs Reviewed  PROTIME-INR - Abnormal; Notable for the following:       Result Value   Prothrombin Time 27.6 (*)    All other components within normal limits  CBC WITH DIFFERENTIAL/PLATELET - Abnormal; Notable for the following:    Hemoglobin 11.9 (*)    HCT 37.4 (*)    All other components within normal limits  COMPREHENSIVE METABOLIC PANEL - Abnormal; Notable for the following:    Glucose, Bld 125 (*)    BUN 30 (*)    All other components within normal limits    EKG  EKG Interpretation None       Radiology No results found.  Procedures Procedures (including critical care time)  Medications Ordered in ED Medications  lidocaine-EPINEPHrine-tetracaine (LET) solution (not administered)  ferrous sulfate tablet 650 mg (650 mg Oral Given 03/19/16 1425)  phytonadione (VITAMIN K) 5 mg in dextrose 5 % 50 mL IVPB (5 mg Intravenous New Bag/Given 03/19/16 1604)  lidocaine-EPINEPHrine-tetracaine (LET) solution (3 mLs Topical Given 03/19/16 1600)    Multiple medications applied topically  No improvement.   Dr. Juleen China in to see.   Pt given Vutamin K.   Pt observed in ED and he has had decreased bleeding.   Initial Impression / Assessment and Plan / ED Course  I have reviewed the triage vital signs and the nursing notes.  Pertinent labs & imaging results that were available during my care of the patient were reviewed by me and considered in my medical decision making (see chart for details).  Clinical Course       Final Clinical Impressions(s) / ED Diagnoses   Final  diagnoses:  Bleeding gums   Pt had decreased bleeding after vit k.   Pt counseled.  He is to hold coumadin tomorrow.  Return if any problems. New Prescriptions New Prescriptions   No medications on file  An After Visit Summary was printed and given to the patient.   Elson Areas, PA-C 03/19/16 1916    Lonia Skinner [place], PA-C 03/19/16 1931    Raeford Razor, MD 03/21/16 [phone]

## 2016-03-19 NOTE — ED Triage Notes (Signed)
Pt reports going to the dentist yesterday for a cleaning. Pt states that he began bleeding from his rt lower gumafter the cleaning. Pt reports continued bleeding. Clots noted at the gum. Pt is on coumadin and has not had levels checked in 3 weeks.

## 2016-03-19 NOTE — ED Notes (Signed)
Patient has continued bleeding from the gums. Endorses Coumadin use but does not have current INR. Denies bruising

## 2016-03-19 NOTE — ED Notes (Signed)
Patient's wife called out and said that he was feeling faint. Upon assessment patient was sitting on the edge of the bed. Patient was dry heaving, diaphoretic, and pale. The patient was assisted into the bed. The patient began vomiting dark red blood into an emesis bag and reported "feeling like he was going to pass out". Patient was hooked up to the monitor and found to mildly hypotensive and tachycardic. EDP notified.

## 2016-03-28 ENCOUNTER — Telehealth (HOSPITAL_COMMUNITY): Payer: Self-pay | Admitting: General Practice

## 2016-03-28 NOTE — Telephone Encounter (Signed)
S/w Marval Regal. from [facility] verifying benefits for pt for Cardiac Rehab. Primary Insurance is BCBS with $4,000.00 Deductible,70% co-insurance and Out of Pocket $4,000.00 with nothing met. No limit of visits.... Reference # [id number]... KJ

## 2018-01-08 ENCOUNTER — Emergency Department (HOSPITAL_COMMUNITY): Payer: BLUE CROSS/BLUE SHIELD

## 2018-01-08 ENCOUNTER — Encounter (HOSPITAL_COMMUNITY): Payer: Self-pay | Admitting: Emergency Medicine

## 2018-01-08 ENCOUNTER — Observation Stay (HOSPITAL_COMMUNITY)
Admission: EM | Admit: 2018-01-08 | Discharge: 2018-01-09 | Disposition: A | Discharge status: Home / Self Care | Payer: BLUE CROSS/BLUE SHIELD | Attending: Cardiovascular Disease | Admitting: Cardiovascular Disease

## 2018-01-08 ENCOUNTER — Other Ambulatory Visit: Payer: Self-pay

## 2018-01-08 DIAGNOSIS — I2510 Atherosclerotic heart disease of native coronary artery without angina pectoris: Secondary | ICD-10-CM | POA: Insufficient documentation

## 2018-01-08 DIAGNOSIS — E7800 Pure hypercholesterolemia, unspecified: Secondary | ICD-10-CM | POA: Diagnosis not present

## 2018-01-08 DIAGNOSIS — R079 Chest pain, unspecified: Principal | ICD-10-CM | POA: Insufficient documentation

## 2018-01-08 DIAGNOSIS — I052 Rheumatic mitral stenosis with insufficiency: Secondary | ICD-10-CM | POA: Diagnosis not present

## 2018-01-08 DIAGNOSIS — I5030 Unspecified diastolic (congestive) heart failure: Secondary | ICD-10-CM | POA: Diagnosis not present

## 2018-01-08 DIAGNOSIS — Z7982 Long term (current) use of aspirin: Secondary | ICD-10-CM | POA: Insufficient documentation

## 2018-01-08 DIAGNOSIS — Z955 Presence of coronary angioplasty implant and graft: Secondary | ICD-10-CM | POA: Insufficient documentation

## 2018-01-08 DIAGNOSIS — Z8249 Family history of ischemic heart disease and other diseases of the circulatory system: Secondary | ICD-10-CM | POA: Insufficient documentation

## 2018-01-08 DIAGNOSIS — I249 Acute ischemic heart disease, unspecified: Secondary | ICD-10-CM | POA: Diagnosis present

## 2018-01-08 DIAGNOSIS — Z79899 Other long term (current) drug therapy: Secondary | ICD-10-CM | POA: Insufficient documentation

## 2018-01-08 DIAGNOSIS — I252 Old myocardial infarction: Secondary | ICD-10-CM | POA: Insufficient documentation

## 2018-01-08 DIAGNOSIS — I420 Dilated cardiomyopathy: Secondary | ICD-10-CM | POA: Insufficient documentation

## 2018-01-08 DIAGNOSIS — I4821 Permanent atrial fibrillation: Secondary | ICD-10-CM | POA: Insufficient documentation

## 2018-01-08 DIAGNOSIS — R931 Abnormal findings on diagnostic imaging of heart and coronary circulation: Secondary | ICD-10-CM | POA: Diagnosis not present

## 2018-01-08 DIAGNOSIS — Z7901 Long term (current) use of anticoagulants: Secondary | ICD-10-CM | POA: Insufficient documentation

## 2018-01-08 DIAGNOSIS — Z9889 Other specified postprocedural states: Secondary | ICD-10-CM | POA: Insufficient documentation

## 2018-01-08 LAB — PROTIME-INR
INR: 2.1
PROTHROMBIN TIME: 23.3 s — AB (ref 11.4–15.2)

## 2018-01-08 LAB — CBC
HEMATOCRIT: 45.2 % (ref 39.0–52.0)
Hemoglobin: 13.8 g/dL (ref 13.0–17.0)
MCH: 28.2 pg (ref 26.0–34.0)
MCHC: 30.5 g/dL (ref 30.0–36.0)
MCV: 92.2 fL (ref 80.0–100.0)
PLATELETS: 187 10*3/uL (ref 150–400)
RBC: 4.9 MIL/uL (ref 4.22–5.81)
RDW: 14.2 % (ref 11.5–15.5)
WBC: 5.2 10*3/uL (ref 4.0–10.5)
nRBC: 0 % (ref 0.0–0.2)

## 2018-01-08 LAB — BASIC METABOLIC PANEL
ANION GAP: 3 — AB (ref 5–15)
BUN: 10 mg/dL (ref 6–20)
CHLORIDE: 107 mmol/L (ref 98–111)
CO2: 31 mmol/L (ref 22–32)
Calcium: 9.5 mg/dL (ref 8.9–10.3)
Creatinine, Ser: 0.9 mg/dL (ref 0.61–1.24)
GFR calc non Af Amer: >60 mL/min (ref >60)
Glucose, Bld: 117 mg/dL — ABNORMAL HIGH (ref 70–99)
Potassium: 4.5 mmol/L (ref 3.5–5.1)
Sodium: 141 mmol/L (ref 135–145)

## 2018-01-08 LAB — I-STAT TROPONIN, ED
Troponin i, poc: 0 ng/mL (ref 0.00–0.08)
Troponin i, poc: 0 ng/mL (ref 0.00–0.08)

## 2018-01-08 LAB — TROPONIN I: Troponin I: <0.03 ng/mL (ref <0.03)

## 2018-01-08 MED ORDER — ASPIRIN EC 81 MG PO TBEC
81.0000 mg | DELAYED_RELEASE_TABLET | Freq: Every day | ORAL | Status: DC
  Administered 2018-01-09: 81 mg via ORAL
  Filled 2018-01-08: qty 1

## 2018-01-08 MED ORDER — LISINOPRIL 2.5 MG PO TABS
2.5000 mg | ORAL_TABLET | Freq: Two times a day (BID) | ORAL | Status: DC
  Administered 2018-01-08 – 2018-01-09 (×2): 2.5 mg via ORAL
  Filled 2018-01-08 (×3): qty 1

## 2018-01-08 MED ORDER — METOPROLOL TARTRATE 25 MG PO TABS
25.0000 mg | ORAL_TABLET | Freq: Two times a day (BID) | ORAL | Status: DC
  Administered 2018-01-08: 25 mg via ORAL
  Filled 2018-01-08 (×2): qty 1

## 2018-01-08 MED ORDER — ATORVASTATIN CALCIUM 20 MG PO TABS: 80.0000 mg | ORAL_TABLET | Freq: Every day | ORAL | Status: DC

## 2018-01-08 MED ORDER — PANTOPRAZOLE SODIUM 40 MG PO TBEC
40.0000 mg | DELAYED_RELEASE_TABLET | Freq: Every day | ORAL | Status: DC
  Administered 2018-01-09: 40 mg via ORAL
  Filled 2018-01-08: qty 1

## 2018-01-08 MED ORDER — ONDANSETRON HCL 4 MG/2ML IJ SOLN: 4.0000 mg | Freq: Four times a day (QID) | INTRAMUSCULAR | Status: DC | PRN

## 2018-01-08 MED ORDER — AMIODARONE HCL 200 MG PO TABS
200.0000 mg | ORAL_TABLET | Freq: Every day | ORAL | Status: DC
  Filled 2018-01-08: qty 1

## 2018-01-08 MED ORDER — TICAGRELOR 90 MG PO TABS
90.0000 mg | ORAL_TABLET | Freq: Two times a day (BID) | ORAL | Status: DC
  Administered 2018-01-08 – 2018-01-09 (×2): 90 mg via ORAL
  Filled 2018-01-08 (×2): qty 1

## 2018-01-08 MED ORDER — NITROGLYCERIN 0.4 MG SL SUBL
0.4000 mg | SUBLINGUAL_TABLET | Freq: Once | SUBLINGUAL | Status: AC
  Administered 2018-01-08: 0.4 mg via SUBLINGUAL
  Filled 2018-01-08: qty 1

## 2018-01-08 MED ORDER — ACETAMINOPHEN 325 MG PO TABS: 650.0000 mg | ORAL_TABLET | ORAL | Status: DC | PRN

## 2018-01-08 MED ORDER — ASPIRIN 81 MG PO CHEW
324.0000 mg | CHEWABLE_TABLET | Freq: Once | ORAL | Status: AC
  Administered 2018-01-08: 324 mg via ORAL
  Filled 2018-01-08: qty 4

## 2018-01-08 MED ORDER — NITROGLYCERIN 0.4 MG SL SUBL: 0.4000 mg | SUBLINGUAL_TABLET | SUBLINGUAL | Status: DC | PRN

## 2018-01-08 NOTE — ED Provider Notes (Signed)
[facility] EMERGENCY DEPARTMENT Provider Note   CSN: [id number] Arrival date & time: 01/08/18  1332     History   Chief Complaint Chief Complaint  Patient presents with  . Chest Pain    HPI Erik Simpson is a 47 y.o. male.  The history is provided by the patient.  Chest Pain   This is a new problem. The current episode started 6 to 12 hours ago. The problem occurs constantly. The problem has been gradually improving. The pain is associated with rest. The pain is present in the lateral region. The pain is at a severity of 4/10. The pain is moderate. The quality of the pain is described as pressure-like. The pain does not radiate. Pertinent negatives include no abdominal pain, no back pain, no cough, no diaphoresis, no dizziness, no fever, no hemoptysis, no leg pain, no lower extremity edema, no nausea, no numbness, no palpitations, no PND, no shortness of breath, no sputum production and no vomiting. He has tried rest for the symptoms. The treatment provided no relief.  His past medical history is significant for CAD and hyperlipidemia.  Pertinent negatives for past medical history include no diabetes, no hypertension and no seizures. Past medical history comments: afib  Procedure history is positive for cardiac catheterization.    Past Medical History:  Diagnosis Date  . Atrial fibrillation (HCC)   . Coronary artery disease   . Hypercholesteremia   . STEMI (ST elevation myocardial infarction) (HCC) 01/27/2016    Patient Active Problem List   Diagnosis Date Noted  . Acute non Q wave myocardial infarction (HCC) 01/27/2016    Past Surgical History:  Procedure Laterality Date  . CARDIAC CATHETERIZATION N/A 01/29/2016   Procedure: Left Heart Cath and Coronary Angiography;  Surgeon: Orpah Cobb, MD;  [facility];  Service: Cardiovascular;  Laterality: N/A;  . CARDIAC CATHETERIZATION N/A 01/29/2016   Procedure: Coronary Stent Intervention;   Surgeon: Rinaldo Cloud, MD;  [facility];  Service: Cardiovascular;  Laterality: N/A;  . CORONARY ANGIOPLASTY WITH STENT PLACEMENT  01/29/2016  . MITRAL VALVE REPAIR  1990s        Home Medications    Prior to Admission medications   Medication Sig Start Date End Date Taking? Authorizing Provider  amiodarone (PACERONE) 200 MG tablet Take 1 tablet (200 mg total) by mouth daily. 02/01/16   Orpah Cobb, MD  aspirin EC 81 MG EC tablet Take 1 tablet (81 mg total) by mouth daily. 02/02/16   Orpah Cobb, MD  atorvastatin (LIPITOR) 80 MG tablet Take 1 tablet (80 mg total) by mouth daily at 6 PM. 02/01/16   Orpah Cobb, MD  enoxaparin (LOVENOX) 80 MG/0.8ML injection Inject 0.7 mLs (70 mg total) into the skin every 12 (twelve) hours. 02/01/16   Orpah Cobb, MD  lisinopril (PRINIVIL,ZESTRIL) 2.5 MG tablet Take 1 tablet (2.5 mg total) by mouth daily. Patient taking differently: Take 2.5 mg by mouth 2 (two) times daily.  02/01/16   Orpah Cobb, MD  metoprolol (LOPRESSOR) 50 MG tablet Take 1 tablet (50 mg total) by mouth 2 (two) times daily. Patient taking differently: Take 25 mg by mouth 2 (two) times daily.  02/01/16   Orpah Cobb, MD  nitroGLYCERIN (NITROSTAT) 0.4 MG SL tablet Place 1 tablet (0.4 mg total) under the tongue every 5 (five) minutes as needed for chest pain. 02/01/16   Orpah Cobb, MD  ondansetron (ZOFRAN ODT) 4 MG disintegrating tablet Take 1 tablet (4 mg total)  by mouth every 8 (eight) hours as needed for nausea or vomiting. 03/19/16   Elson Areas, PA-C  pantoprazole (PROTONIX) 40 MG tablet Take 1 tablet (40 mg total) by mouth daily at 6 (six) AM. 02/02/16   Orpah Cobb, MD  ticagrelor (BRILINTA) 90 MG TABS tablet Take 1 tablet (90 mg total) by mouth 2 (two) times daily. 02/01/16   Orpah Cobb, MD  warfarin (COUMADIN) 2.5 MG tablet Take 2.5 mg by mouth See admin instructions. Pt takes 2.5mg  once daily, except Monday and Tuesday    [provider]    Family History Family History  Problem Relation Age of Onset  . Coronary artery disease Mother     Social History Social History   Tobacco Use  . Smoking status: Never Smoker  . Smokeless tobacco: Never Used  Substance Use Topics  . Alcohol use: No  . Drug use: No     Allergies   Patient has no known allergies.   Review of Systems Review of Systems  Constitutional: Negative for chills, diaphoresis and fever.  HENT: Negative for ear pain and sore throat.   Eyes: Negative for pain and visual disturbance.  Respiratory: Negative for cough, hemoptysis, sputum production and shortness of breath.   Cardiovascular: Positive for chest pain. Negative for palpitations and PND.  Gastrointestinal: Negative for abdominal pain, nausea and vomiting.  Genitourinary: Negative for dysuria and hematuria.  Musculoskeletal: Negative for arthralgias and back pain.  Skin: Negative for color change and rash.  Neurological: Negative for dizziness, seizures, syncope and numbness.  All other systems reviewed and are negative.    Physical Exam Updated Vital Signs  ED Triage Vitals  Enc Vitals Group     BP 01/08/18 1338 138/79     Pulse Rate 01/08/18 1338 67     Resp 01/08/18 1338 12     Temp 01/08/18 1338 98.9 F (37.2 C)     Temp Source 01/08/18 1338 Oral     SpO2 01/08/18 1338 100 %     Weight 01/08/18 1338 150 lb (68 kg)     Height 01/08/18 1338 5\' 11"  (1.803 m)     Head Circumference --      Peak Flow --      Pain Score 01/08/18 1350 5     Pain Loc --      Pain Edu? --      Excl. in GC? --     Physical Exam  Constitutional: He appears well-developed and well-nourished.  HENT:  Head: Normocephalic and atraumatic.  Eyes: Pupils are equal, round, and reactive to light. Conjunctivae and EOM are normal.  Neck: Normal range of motion. Neck supple.  Cardiovascular: Normal rate, intact distal pulses and normal pulses. An irregularly irregular rhythm present.  No murmur  heard. Pulmonary/Chest: Effort normal and breath sounds normal. No respiratory distress. He has no decreased breath sounds. He has no wheezes. He has no rales.  Abdominal: Soft. There is no tenderness.  Musculoskeletal: He exhibits no edema.       Right lower leg: He exhibits no edema.       Left lower leg: He exhibits no edema.  Neurological: He is alert.  Skin: Skin is warm and dry. Capillary refill takes less than 2 seconds.  Psychiatric: He has a normal mood and affect.  Nursing note and vitals reviewed.    ED Treatments / Results  Labs (all labs ordered are listed, but only abnormal results are displayed) Labs Reviewed  BASIC  METABOLIC PANEL - Abnormal; Notable for the following components:      Result Value   Glucose, Bld 117 (*)    Anion gap 3 (*)    All other components within normal limits  PROTIME-INR - Abnormal; Notable for the following components:   Prothrombin Time 23.3 (*)    All other components within normal limits  CBC  I-STAT TROPONIN, ED  I-STAT TROPONIN, ED    EKG EKG Interpretation  Date/Time:  Friday January 08 2018 13:43:15 EDT Ventricular Rate:  72 PR Interval:    QRS Duration: 98 QT Interval:  400 QTC Calculation: 438 R Axis:   68 Text Interpretation:  Atrial fibrillation Nonspecific T wave abnormality Abnormal ECG Confirmed by Virgina Norfolk [phone]) on 01/08/2018 6:16:09 PM Also confirmed by Virgina Norfolk [phone])  on 01/08/2018 7:24:01 PM   Radiology Dg Chest 2 View  Result Date: 01/08/2018 CLINICAL DATA:  MI x 2 years ago. New onset of Left Mid chest wall pain since 3am. Non smoker. EXAM: CHEST - 2 VIEW COMPARISON:  Chest x-ray dated 01/27/2016. FINDINGS: Mild cardiomegaly is stable. Perihilar regions are unchanged, more likely chronic bronchitic change then central pulmonary vascular congestion. No evidence of consolidating pneumonia, pleural effusion or pneumothorax. Osseous structures about the chest are unremarkable. IMPRESSION: 1. No  active cardiopulmonary disease. No evidence of pneumonia or pulmonary edema. 2. Stable mild cardiomegaly. 3. Suspected chronic bronchitic change. Electronically Signed   By: Bary Richard M.D.   On: 01/08/2018 14:22    Procedures Procedures (including critical care time)  Medications Ordered in ED Medications  aspirin chewable tablet 324 mg (324 mg Oral Given 01/08/18 1843)  nitroGLYCERIN (NITROSTAT) SL tablet 0.4 mg (0.4 mg Sublingual Given 01/08/18 1843)     Initial Impression / Assessment and Plan / ED Course  I have reviewed the triage vital signs and the nursing notes.  Pertinent labs & imaging results that were available during my care of the patient were reviewed by me and considered in my medical decision making (see chart for details).     Erik Simpson is a 47 year old male with history of atrial fibrillation on Coumadin, CAD status post stent who presents to the ED with chest pain.  Patient with unremarkable vitals.  No fever.  Patient with chest pain that started several hours prior to arrival that improved with nitroglycerin and aspirin.  Patient denies any infectious symptoms.  States he is compliant with his medications.  Patient typically does not have chest pain and has been overall doing pretty well since his stent placed 2 years ago.  Patient with EKG that shows atrial fibrillation with no signs of ischemic changes.  Exam is overall unremarkable.  Clear breath sounds.  No signs of volume overload.  Chest x-ray showed no signs of pneumonia, no pneumothorax, no pleural effusion.  Patient otherwise no significant anemia, electrolyte abnormality, kidney injury, leukocytosis.  Patient with INR wnl.  Talked with Dr. Algie Coffer with cardiology he will admit the patient for further ACS rule out.  No concern for DVT or PE.  Hemodynamically stable throughout my care.  Chest pain improved following nitroglycerin.  This chart was dictated using voice recognition software.  Despite best efforts  to proofread,  errors can occur which can change the documentation meaning.   Final Clinical Impressions(s) / ED Diagnoses   Final diagnoses:  Chest pain, unspecified type    ED Discharge Orders    None       Virgina Norfolk, DO 01/08/18 1929

## 2018-01-08 NOTE — ED Triage Notes (Signed)
Significant past medical hx of STEMI 2 years ago.

## 2018-01-08 NOTE — Plan of Care (Signed)
  Problem: Clinical Measurements: Goal: Diagnostic test results will improve Outcome: Progressing   Problem: Clinical Measurements: Goal: Cardiovascular complication will be avoided Outcome: Progressing   Problem: Nutrition: Goal: Adequate nutrition will be maintained Outcome: Progressing   

## 2018-01-08 NOTE — H&P (Signed)
Referring Physician:  [name] is an 47 y.o. male.                       Chief Complaint: Chest pain  HPI: 47 year old male with PMH of atrial fibrillation, CAD, S/P MV balloon valvuloplasty in [place] in 2000, S/P LAD stent, presented with pressure type chest pain improving with SL NTG and aspirin use. No fever or cough.  EKG showed atrial fibrillation with controlled ventricular response and non-specific T wave changes.  Past Medical History:  Diagnosis Date  . Atrial fibrillation ([place])   . Coronary artery disease   . Hypercholesteremia   . STEMI (ST elevation myocardial infarction) ([place]) 01/27/2016      Past Surgical History:  Procedure Laterality Date  . CARDIAC CATHETERIZATION N/A 01/29/2016   Procedure: Left Heart Cath and Coronary Angiography;  Surgeon: Dixie Dials, MD;  Location: [place] CV LAB;  Service: Cardiovascular;  Laterality: N/A;  . CARDIAC CATHETERIZATION N/A 01/29/2016   Procedure: Coronary Stent Intervention;  Surgeon: Charolette Forward, MD;  Location: [place] CV LAB;  Service: Cardiovascular;  Laterality: N/A;  . CORONARY ANGIOPLASTY WITH STENT PLACEMENT  01/29/2016  . MITRAL VALVE REPAIR  1990s    Family History  Problem Relation Age of Onset  . Coronary artery disease Mother    Social History:  reports that he has never smoked. He has never used smokeless tobacco. He reports that he does not drink alcohol or use drugs.  Allergies: No Known Allergies   (Not in a hospital admission)  Results for orders placed or performed during the hospital encounter of 01/08/18 (from the past 48 hour(s))  Basic metabolic panel     Status: Abnormal   Collection Time: 01/08/18  1:57 PM  Result Value Ref Range   Sodium 141 135 - 145 mmol/L   Potassium 4.5 3.5 - 5.1 mmol/L   Chloride 107 98 - 111 mmol/L   CO2 31 22 - 32 mmol/L   Glucose, Bld 117 (H) 70 - 99 mg/dL   BUN 10 6 - 20 mg/dL   Creatinine, Ser 0.90 0.61 - 1.24 mg/dL   Calcium 9.5 8.9 - 10.3 mg/dL   GFR calc non Af Amer >60 >60 mL/min   GFR calc Af Amer >60 >60 mL/min    Comment: (NOTE) The eGFR has been calculated using the CKD EPI equation. This calculation has not been validated in all clinical situations. eGFR's persistently <60 mL/min signify possible Chronic Kidney Disease.    Anion gap 3 (L) 5 - 15    Comment: Performed at [facility], [place] [address], [place] [address]  CBC     Status: None   Collection Time: 01/08/18  1:57 PM  Result Value Ref Range   WBC 5.2 4.0 - 10.5 K/uL   RBC 4.90 4.22 - 5.81 MIL/uL   Hemoglobin 13.8 13.0 - 17.0 g/dL   HCT 45.2 39.0 - 52.0 %   MCV 92.2 80.0 - 100.0 fL   MCH 28.2 26.0 - 34.0 pg   MCHC 30.5 30.0 - 36.0 g/dL   RDW 14.2 11.5 - 15.5 %   Platelets 187 150 - 400 K/uL   nRBC 0.0 0.0 - 0.2 %    Comment: Performed at [facility], [place] [address], [place], [place] [address]  I-stat troponin, ED     Status: None   Collection Time: 01/08/18  2:07 PM  Result Value Ref Range  Troponin i, poc 0.00 0.00 - 0.08 ng/mL   Comment 3            Comment: Due to the release kinetics of cTnI, a negative result within the first hours of the onset of symptoms does not rule out myocardial infarction with certainty. If myocardial infarction is still suspected, repeat the test at appropriate intervals.   Protime-INR     Status: Abnormal   Collection Time: 01/08/18  6:38 PM  Result Value Ref Range   Prothrombin Time 23.3 (H) 11.4 - 15.2 seconds   INR 2.10     Comment: Performed at [facility], [place] [address] [place], [place] [address]   Dg Chest 2 View  Result Date: 01/08/2018 CLINICAL DATA:  MI x 2 years ago. New onset of Left Mid chest wall pain since 3am. Non smoker. EXAM: CHEST - 2 VIEW COMPARISON:  Chest x-ray dated 01/27/2016. FINDINGS: Mild cardiomegaly is stable. Perihilar regions are unchanged, more likely chronic bronchitic change then central pulmonary vascular congestion. No evidence of consolidating  pneumonia, pleural effusion or pneumothorax. Osseous structures about the chest are unremarkable. IMPRESSION: 1. No active cardiopulmonary disease. No evidence of pneumonia or pulmonary edema. 2. Stable mild cardiomegaly. 3. Suspected chronic bronchitic change. Electronically Signed   By: Franki Cabot M.D.   On: 01/08/2018 14:22    Review Of Systems Constitutional: No fever, chills, weight loss or gain. Eyes: No vision change, wears glasses. No discharge or pain. Ears: No hearing loss, No tinnitus. Respiratory: No asthma, COPD, pneumonias. No shortness of breath. No hemoptysis. Cardiovascular: Occasional chest pain, palpitation, no leg edema. Gastrointestinal: No nausea, vomiting, diarrhea, constipation. No GI bleed. No hepatitis. Genitourinary: No dysuria, hematuria, kidney stone. No incontinance. Neurological: No headache, stroke, seizures.  Psychiatry: No psych facility admission for anxiety, depression, suicide. No detox. Skin: No rash. Musculoskeletal: No joint pain, fibromyalgia. No neck pain, positive back pain. Lymphadenopathy: No lymphadenopathy. Hematology: No anemia, positive easy bruising from medication use.   Blood pressure (!) 132/94, pulse 65, temperature 98.9 F (37.2 C), temperature source Oral, resp. rate 12, height '5\' 11"'$  (1.803 m), weight 68 kg, SpO2 99 %. Body mass index is 20.92 kg/m. General appearance: alert, cooperative, appears stated age and no distress Head: Normocephalic, atraumatic. Eyes: Brown eyes, pink conjunctiva, corneas clear. PERRL, EOM's intact. Neck: No adenopathy, no carotid bruit, no JVD, supple, symmetrical, trachea midline and thyroid not enlarged. Resp: Clear to auscultation bilaterally. Cardio: Irregular rate and rhythm, S1, S2 normal, II/VI systolic murmur, no click, rub or gallop GI: Soft, non-tender; bowel sounds normal; no organomegaly. Extremities: No edema, cyanosis or clubbing. Skin: Warm and dry.  Neurologic: Alert and oriented X  3, normal strength. Normal coordination and gait.  Assessment/Plan Acute coronary syndrome CAD PTCA LAD MV disease Chronic permanent atrial fibrillation Chronic warfarin use  Place in observation NM stress test in AM  Birdie Riddle, MD  01/08/2018, 7:50 PM

## 2018-01-08 NOTE — ED Triage Notes (Signed)
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 Reports pain is worsened by movement. Describes the feeling as tight. Denies shortness of breath, dizziness, nausea, etc. Pt in NAD.

## 2018-01-09 ENCOUNTER — Observation Stay (HOSPITAL_COMMUNITY): Payer: BLUE CROSS/BLUE SHIELD

## 2018-01-09 DIAGNOSIS — R079 Chest pain, unspecified: Secondary | ICD-10-CM | POA: Diagnosis not present

## 2018-01-09 DIAGNOSIS — I2510 Atherosclerotic heart disease of native coronary artery without angina pectoris: Secondary | ICD-10-CM | POA: Diagnosis not present

## 2018-01-09 DIAGNOSIS — I4821 Permanent atrial fibrillation: Secondary | ICD-10-CM | POA: Diagnosis not present

## 2018-01-09 DIAGNOSIS — I249 Acute ischemic heart disease, unspecified: Secondary | ICD-10-CM | POA: Diagnosis not present

## 2018-01-09 LAB — CBC
HCT: 42.2 % (ref 39.0–52.0)
Hemoglobin: 13.6 g/dL (ref 13.0–17.0)
MCH: 28.8 pg (ref 26.0–34.0)
MCHC: 32.2 g/dL (ref 30.0–36.0)
MCV: 89.2 fL (ref 80.0–100.0)
NRBC: 0 % (ref 0.0–0.2)
PLATELETS: 157 10*3/uL (ref 150–400)
RBC: 4.73 MIL/uL (ref 4.22–5.81)
RDW: 14.3 % (ref 11.5–15.5)
WBC: 3.1 10*3/uL — ABNORMAL LOW (ref 4.0–10.5)

## 2018-01-09 LAB — LIPID PANEL
Cholesterol: 103 mg/dL (ref 0–200)
HDL: 47 mg/dL (ref >40)
LDL CALC: 48 mg/dL (ref 0–99)
Total CHOL/HDL Ratio: 2.2 RATIO
Triglycerides: 40 mg/dL (ref <150)
VLDL: 8 mg/dL (ref 0–40)

## 2018-01-09 LAB — BASIC METABOLIC PANEL
Anion gap: 12 (ref 5–15)
BUN: 7 mg/dL (ref 6–20)
CHLORIDE: 105 mmol/L (ref 98–111)
CO2: 24 mmol/L (ref 22–32)
CREATININE: 0.77 mg/dL (ref 0.61–1.24)
Calcium: 9.2 mg/dL (ref 8.9–10.3)
GFR calc Af Amer: >60 mL/min (ref >60)
GFR calc non Af Amer: >60 mL/min (ref >60)
Glucose, Bld: 89 mg/dL (ref 70–99)
Potassium: 3.9 mmol/L (ref 3.5–5.1)
Sodium: 141 mmol/L (ref 135–145)

## 2018-01-09 LAB — ECHOCARDIOGRAM COMPLETE
Height: 71 in
WEIGHTICAEL: 2560 [oz_av]

## 2018-01-09 LAB — PROTIME-INR
INR: 1.91
PROTHROMBIN TIME: 21.7 s — AB (ref 11.4–15.2)

## 2018-01-09 LAB — TROPONIN I
Troponin I: <0.03 ng/mL (ref <0.03)
Troponin I: <0.03 ng/mL (ref <0.03)

## 2018-01-09 LAB — HIV ANTIBODY (ROUTINE TESTING W REFLEX): HIV SCREEN 4TH GENERATION: NONREACTIVE

## 2018-01-09 MED ORDER — REGADENOSON 0.4 MG/5ML IV SOLN
INTRAVENOUS | Status: AC
  Filled 2018-01-09: qty 5

## 2018-01-09 MED ORDER — TECHNETIUM TC 99M TETROFOSMIN IV KIT
10.0000 | PACK | Freq: Once | INTRAVENOUS | Status: AC | PRN
  Administered 2018-01-09: 10 via INTRAVENOUS

## 2018-01-09 MED ORDER — TECHNETIUM TC 99M TETROFOSMIN IV KIT
30.0000 | PACK | Freq: Once | INTRAVENOUS | Status: AC | PRN
  Administered 2018-01-09: 30 via INTRAVENOUS

## 2018-01-09 MED ORDER — METOPROLOL TARTRATE 50 MG PO TABS: 25.0000 mg | ORAL_TABLET | Freq: Two times a day (BID) | ORAL | Status: AC

## 2018-01-09 MED ORDER — REGADENOSON 0.4 MG/5ML IV SOLN
0.4000 mg | Freq: Once | INTRAVENOUS | Status: AC
  Administered 2018-01-09: 0.4 mg via INTRAVENOUS
  Filled 2018-01-09: qty 5

## 2018-01-09 NOTE — Progress Notes (Signed)
Patient resting comfortably during shift report. Denies complaints.  

## 2018-01-09 NOTE — Discharge Summary (Signed)
Physician Discharge Summary  Patient ID: Erik Simpson MRN: [id number] DOB/AGE: [birth year] 47 y.o.  Admit date: 01/08/2018 Discharge date: 01/09/2018  Admission Diagnoses: Acute coronary syndrome CAD PTCA LAD MV disease Chronic permanent atrial fibrillation Chronic warfarin use  Discharge Diagnoses:  Principle Problem: Acute coronary syndrome [facility]) Active Problems:   Moderate MV stenosis   Severe MV regurgitation   CAD   PTCA LAD   Chronic permanent atrial fibrillation   Chronic warfarin use  Discharged Condition: fair  Hospital Course: 47 year old male with PMH of open surgical valvuloplasty done in [place] in 2000, CAD, S/P LAD in 01/2016, Permanent atrial fibrillation with massive dilatation of Left Atrium had pressure type chest pain.  His troponin levels were normal. His NM myocardial perfusion stress test showed large area of remote infarct of septum and apex, EF 47 %.  Echocardiogram showed 50-55 % EF with moderate MS and severe MR and massively dilated LA. He has decreased his activity over last one year.  CVTS OP consult is arranged to discuss timing of MV replacement if his functional capacity can be increased. TEE and cardiac cath will be done on OP basis per patient request. He will see me in 1 week for follow up and PT/INR check.  Consults: cardiology  Significant Diagnostic Studies: labs: Normal CBC, BMET with borderline blood sugar levels, normal Lipid panel and Troponin I.  EKG : Atrial fibrillation with non-specific ST-T changes.  Chest x-ray: No active disease and stable cardiomegaly.  NM myocardial perfusion stress test: Large remote septal and apical defect. EF 47 %.  Echocardiogram : Low Normal LV systolic function, EF 50 -55 %. Moderate MS and severe MR and massively dilated LA.   Treatments: anticoagulation: ASA and warfarin. Cardiac Meds: Losartan, Metoprolol, Amiodarone, Brilinta and Atorvastatin.  Discharge Exam: Blood pressure 104/63, pulse (!)  57, temperature 98 F (36.7 C), temperature source Oral, resp. rate 20, height 5\' 11"  (1.803 m), weight 72.6 kg, SpO2 100 %. General appearance: alert, cooperative and appears stated age. Head: Normocephalic, atraumatic. Eyes: Brown eyes, pink conjunctiva, corneas clear. PERRL, EOM's intact.  Neck: No adenopathy, no carotid bruit, no JVD, supple, symmetrical, trachea midline and thyroid not enlarged. Resp: Clear to auscultation bilaterally. Cardio: Regular rate and rhythm, S1, S2 normal, III/VI systolic murmur, Ii/VI diastolic murmur. no click, rub or gallop. GI: Soft, non-tender; bowel sounds normal; no organomegaly. Extremities: No edema, cyanosis or clubbing. Skin: Warm and dry.  Neurologic: Alert and oriented X 3, normal strength and tone. Normal coordination and gait.  Disposition: Discharge disposition: 01-Home or Self Care        Allergies as of 01/09/2018   No Known Allergies     Medication List    STOP taking these medications   lisinopril 2.5 MG tablet Commonly known as:  PRINIVIL,ZESTRIL   ondansetron 4 MG disintegrating tablet Commonly known as:  ZOFRAN-ODT   pantoprazole 40 MG tablet Commonly known as:  PROTONIX     TAKE these medications   amiodarone 200 MG tablet Commonly known as:  PACERONE Take 1 tablet (200 mg total) by mouth daily.   aspirin 81 MG EC tablet Take 1 tablet (81 mg total) by mouth daily.   atorvastatin 80 MG tablet Commonly known as:  LIPITOR Take 1 tablet (80 mg total) by mouth daily at 6 PM.   cholecalciferol 1000 units tablet Commonly known as:  VITAMIN D Take 1,000 Units by mouth daily.   ferrous sulfate 325 (65 FE) MG tablet Take 325 mg  by mouth daily with breakfast.   losartan 25 MG tablet Commonly known as:  COZAAR Take 25 mg by mouth daily.   metoprolol tartrate 50 MG tablet Commonly known as:  LOPRESSOR Take 0.5 tablets (25 mg total) by mouth 2 (two) times daily.   nitroGLYCERIN 0.4 MG SL tablet Commonly known  as:  NITROSTAT Place 1 tablet (0.4 mg total) under the tongue every 5 (five) minutes as needed for chest pain.   omeprazole 40 MG capsule Commonly known as:  PRILOSEC Take 40 mg by mouth daily.   ticagrelor 60 MG Tabs tablet Commonly known as:  BRILINTA Take 60 mg by mouth 2 (two) times daily. What changed:  Another medication with the same name was removed. Continue taking this medication, and follow the directions you see here.   vitamin C 500 MG tablet Commonly known as:  ASCORBIC ACID Take 500 mg by mouth daily.   warfarin 2 MG tablet Commonly known as:  COUMADIN Take 1-2 mg by mouth See admin instructions. Take 1 tablet all days EXCEPT Monday take 1/2 tablet      Follow-up Information    Orpah Cobb, MD. Schedule an appointment as soon as possible for a visit in 1 week(s).   Specialty:  Cardiology Contact information: [address] [place] [address] [phone]        Purcell Nails, MD. Schedule an appointment as soon as possible for a visit in 2 week(s).   Specialty:  Cardiothoracic Surgery Contact information: [address] [place] [address] [place] [address] [phone]           Signed: Ricki Rodriguez 01/09/2018, 5:01 PM

## 2018-01-09 NOTE — Progress Notes (Signed)
Per Dr Algie Coffer hold pt's amiodarone and metoprolol, give lisinopril; r/t low BP and HR today.   Pharmacy notes that there is no VTE ordered for pt, if pt does not get discharged after rounding by MD, will request order for VTE.

## 2018-01-09 NOTE — Progress Notes (Signed)
Call placed to [facility] to notify of telemetry monitoring d/c.   

## 2018-01-09 NOTE — Progress Notes (Signed)
  Echocardiogram 2D Echocardiogram has been performed.  Carla Whilden G Khaleel Beckom 01/09/2018, 11:39 AM

## 2018-04-22 ENCOUNTER — Encounter: Payer: Self-pay | Admitting: Interventional Cardiology

## 2018-05-10 ENCOUNTER — Ambulatory Visit (INDEPENDENT_AMBULATORY_CARE_PROVIDER_SITE_OTHER): Payer: BLUE CROSS/BLUE SHIELD | Admitting: Interventional Cardiology

## 2018-05-10 ENCOUNTER — Encounter: Payer: Self-pay | Admitting: Interventional Cardiology

## 2018-05-10 VITALS — BP 128/62 | HR 76 | Ht 71.0 in | Wt 170.0 lb

## 2018-05-10 DIAGNOSIS — I480 Paroxysmal atrial fibrillation: Secondary | ICD-10-CM | POA: Diagnosis not present

## 2018-05-10 DIAGNOSIS — I25118 Atherosclerotic heart disease of native coronary artery with other forms of angina pectoris: Secondary | ICD-10-CM

## 2018-05-10 DIAGNOSIS — Z7901 Long term (current) use of anticoagulants: Secondary | ICD-10-CM | POA: Diagnosis not present

## 2018-05-10 DIAGNOSIS — I051 Rheumatic mitral insufficiency: Secondary | ICD-10-CM | POA: Diagnosis not present

## 2018-05-10 MED ORDER — CLOPIDOGREL BISULFATE 75 MG PO TABS: 75.0000 mg | ORAL_TABLET | Freq: Every day | ORAL | 3 refills | Status: DC

## 2018-05-10 NOTE — Patient Instructions (Addendum)
Medication Instructions:  Your physician has recommended you make the following change in your medication:   1. STOP: Aspirin  2. STOP: ticagrelor (brilinta)  3. START: clopidogrel (plavix) 75 mg tablet: Take 1 tablet by mouth once a day  Lab work: None Ordered  If you have labs (blood work) drawn today and your tests are completely normal, you will receive your results only by: Marland Kitchen MyChart Message (if you have MyChart) OR . A paper copy in the mail If you have any lab test that is abnormal or we need to change your treatment, we will call you to review the results.  Testing/Procedures: None ordered  Follow-Up: . Your physician recommends that you schedule a follow-up appointment in: 3 months with Dr. Eldridge Dace .   Any Other Special Instructions Will Be Listed Below (If Applicable).

## 2018-05-10 NOTE — Progress Notes (Signed)
Cardiology Office Note   Date:  05/10/2018   ID:  Erik Simpson, DOB Jan 30, 1971, MRN [id number]  PCP:  Erik Cobb, MD    No chief complaint on file.  MV disease  Wt Readings from Last 3 Encounters:  05/10/18 170 lb (77.1 kg)  01/09/18 160 lb (72.6 kg)  02/01/16 160 lb 6.4 oz (72.8 kg)       History of Present Illness:  Erik Simpson is a 48 y.o. male who is being seen today for the evaluation of mitral regurgitation at the request of patient for second opinion. Erik Simpson is a 48 y.o. male  Who was seen in the past by Dr. Algie Simpson :"with PMH of atrial fibrillation, CAD, S/P MV balloon valvuloplasty in [place] in 2000, S/P LAD stent."  MV repair in [place] in 1990, age 4.  He did well for 10 years and then had SHOB.   Balloon valvuloplasty in 2000.   LAD stent placed in 2017.  He had severe chest and arm pain.    01/2018:  Was hospitalized for chest pain- negative troponin.  TEE and cath was recommended at that time, for mitral regurgitation.  These tests have not happened.  Brilinta dose was adjusted due to gum bleeding.  COumadin has been constant.    Denies : Chest pain. Dizziness. Leg edema. Nitroglycerin use. Orthopnea. Palpitations. Paroxysmal nocturnal dyspnea. Syncope.   No sx like what he had in 2017.   He does have DOE.     Past Medical History:  Diagnosis Date  . Atrial fibrillation (HCC)   . Coronary artery disease   . Hypercholesteremia   . STEMI (ST elevation myocardial infarction) (HCC) 01/27/2016    Past Surgical History:  Procedure Laterality Date  . CARDIAC CATHETERIZATION N/A 01/29/2016   Procedure: Left Heart Cath and Coronary Angiography;  Surgeon: Erik Cobb, MD;  Location: [facility] INVASIVE CV LAB;  Service: Cardiovascular;  Laterality: N/A;  . CARDIAC CATHETERIZATION N/A 01/29/2016   Procedure: Coronary Stent Intervention;  Surgeon: Erik Cloud, MD;  Location: [facility] INVASIVE CV LAB;  Service: Cardiovascular;  Laterality: N/A;  . CORONARY  ANGIOPLASTY WITH STENT PLACEMENT  01/29/2016  . MITRAL VALVE REPAIR  1990s     Current Outpatient Medications  Medication Sig Dispense Refill  . amiodarone (PACERONE) 200 MG tablet Take 1 tablet (200 mg total) by mouth daily. 30 tablet 3  . aspirin EC 81 MG EC tablet Take 1 tablet (81 mg total) by mouth daily.    Marland Kitchen atorvastatin (LIPITOR) 80 MG tablet Take 1 tablet (80 mg total) by mouth daily at 6 PM. 30 tablet 3  . cholecalciferol (VITAMIN D) 1000 units tablet Take 1,000 Units by mouth daily.    . ferrous sulfate 325 (65 FE) MG tablet Take 325 mg by mouth daily with breakfast.    . losartan (COZAAR) 25 MG tablet Take 25 mg by mouth daily.    . metoprolol tartrate (LOPRESSOR) 50 MG tablet Take 0.5 tablets (25 mg total) by mouth 2 (two) times daily.    . nitroGLYCERIN (NITROSTAT) 0.4 MG SL tablet Place 1 tablet (0.4 mg total) under the tongue every 5 (five) minutes as needed for chest pain. 25 tablet 1  . omeprazole (PRILOSEC) 40 MG capsule Take 40 mg by mouth daily.    . ticagrelor (BRILINTA) 60 MG TABS tablet Take 60 mg by mouth 2 (two) times daily.    . vitamin C (ASCORBIC ACID) 500 MG tablet Take 500 mg by mouth daily.    Marland Kitchen  warfarin (COUMADIN) 2 MG tablet Take 1-2 mg by mouth See admin instructions. Take 1 tablet all days EXCEPT Monday take 1/2 tablet     No current facility-administered medications for this visit.     Allergies:   Patient has no known allergies.    Social History:  The patient  reports that he has never smoked. He has never used smokeless tobacco. He reports that he does not drink alcohol or use drugs.   Family History:  The patient's family history includes Coronary artery disease in his mother.    ROS:  Please see the history of present illness.   Otherwise, review of systems are positive for .   All other systems are reviewed and negative.    PHYSICAL EXAM: VS:  BP 128/62   Pulse 76   Ht 5\' 11"  (1.803 m)   Wt 170 lb (77.1 kg)   SpO2 97%   BMI 23.71  kg/m  , BMI Body mass index is 23.71 kg/m. GEN: Well nourished, well developed, in no acute distress  HEENT: normal  Neck: no JVD, carotid bruits, or masses Cardiac: RRR; no murmurs, rubs, or gallops,no edema  Respiratory:  clear to auscultation bilaterally, normal work of breathing GI: soft, nontender, nondistended, + BS MS: no deformity or atrophy  Skin: warm and dry, no rash Neuro:  Strength and sensation are intact Psych: euthymic mood, full affect   EKG:   The ekg ordered today demonstrates AFib, rate controlled   Recent Labs: 01/09/2018: BUN 7; Creatinine, Ser 0.77; Hemoglobin 13.6; Platelets 157; Potassium 3.9; Sodium 141   Lipid Panel    Component Value Date/Time   CHOL 103 01/09/2018 0157   TRIG 40 01/09/2018 0157   HDL 47 01/09/2018 0157   CHOLHDL 2.2 01/09/2018 0157   VLDL 8 01/09/2018 0157   LDLCALC 48 01/09/2018 0157     Other studies Reviewed: Additional studies/ records that were reviewed today with results demonstrating: Hospital records reviewed.   ASSESSMENT AND PLAN:  1. Mitral regurgitation: Severe by transthoracic echo.  I think he needs a TEE.  His current issue is a social situation.  He lives alone and has no one to transport him.  His sister will be moving to live with him this summer.  He would like to delay testing until that time.  I will review the transthoracic images.  Ejection fraction was still reported in the low normal range in November 2019.  No obvious congestive heart failure at rest, but he does report dyspnea on exertion which is getting worse.  This could be due to progression of his mitral valve disease. 2. CAD: Prior LAD stent.  No symptoms like his prior angina.  Stress test in November 2019 showed no inducible ischemia.  He has been on low-dose Brilinta.  He continues to have bleeding issues.  Will stop Brilinta and start Plavix 75 mg daily.  We will also stop aspirin at this time.  Hopefully, this will stop the gum bleeding issue.   If TEE shows surgical disease of the MV, would plan for right and left heart cath. 3. Mitral stenosis: He has had several mitral valve surgeries in the past.  The only scar he has on his chest is on the left chest, at the lower border of his rib cage, parallel to the ribs.  There is no median sternotomy scar. 4. Atrial fibrillation: Continue amiodarone for now.  If he does require heart surgery, Maze procedure would be helpful.  Would not  want to keep him on long-term amiodarone given his young age.  If he does not maintain NSR, would stop Amio.   Current medicines are reviewed at length with the patient today.  The patient concerns regarding his medicines were addressed.  The following changes have been made:   Labs/ tests ordered today include:  No orders of the defined types were placed in this encounter.   Recommend 150 minutes/week of aerobic exercise Low fat, low carb, high fiber diet recommended  Disposition:   FU in    Signed, Lance Muss, MD  05/10/2018 10:21 AM    [facility] [address] [place], [place], [place]  [address] Phone: [phone]; Fax: [phone]

## 2018-05-11 ENCOUNTER — Telehealth (HOSPITAL_COMMUNITY): Payer: Self-pay | Admitting: Licensed Clinical Social Worker

## 2018-05-11 NOTE — Telephone Encounter (Signed)
CSW received referral to assist patient with transportation needs for possible upcoming procedure. CSW left message for return call. Lasandra Beech, LCSW, CCSW-MCS [phone]

## 2018-08-05 ENCOUNTER — Telehealth: Payer: Self-pay

## 2018-08-05 NOTE — Telephone Encounter (Signed)
Follow up    Pt is returning call and canceling his appt

## 2018-08-05 NOTE — Telephone Encounter (Signed)
Called pt to set up possible evisit. Left message asking pt to call the office.  

## 2018-08-09 ENCOUNTER — Ambulatory Visit: Payer: BLUE CROSS/BLUE SHIELD | Admitting: Interventional Cardiology

## 2018-09-18 IMAGING — DX DG CHEST 2V
2 series · 2 of 2 positions shown · non-contrast
Comparison: None.

CLINICAL DATA: Chest pain.  Fall earlier today

EXAM:
CHEST  2 VIEW

[w chest pa]
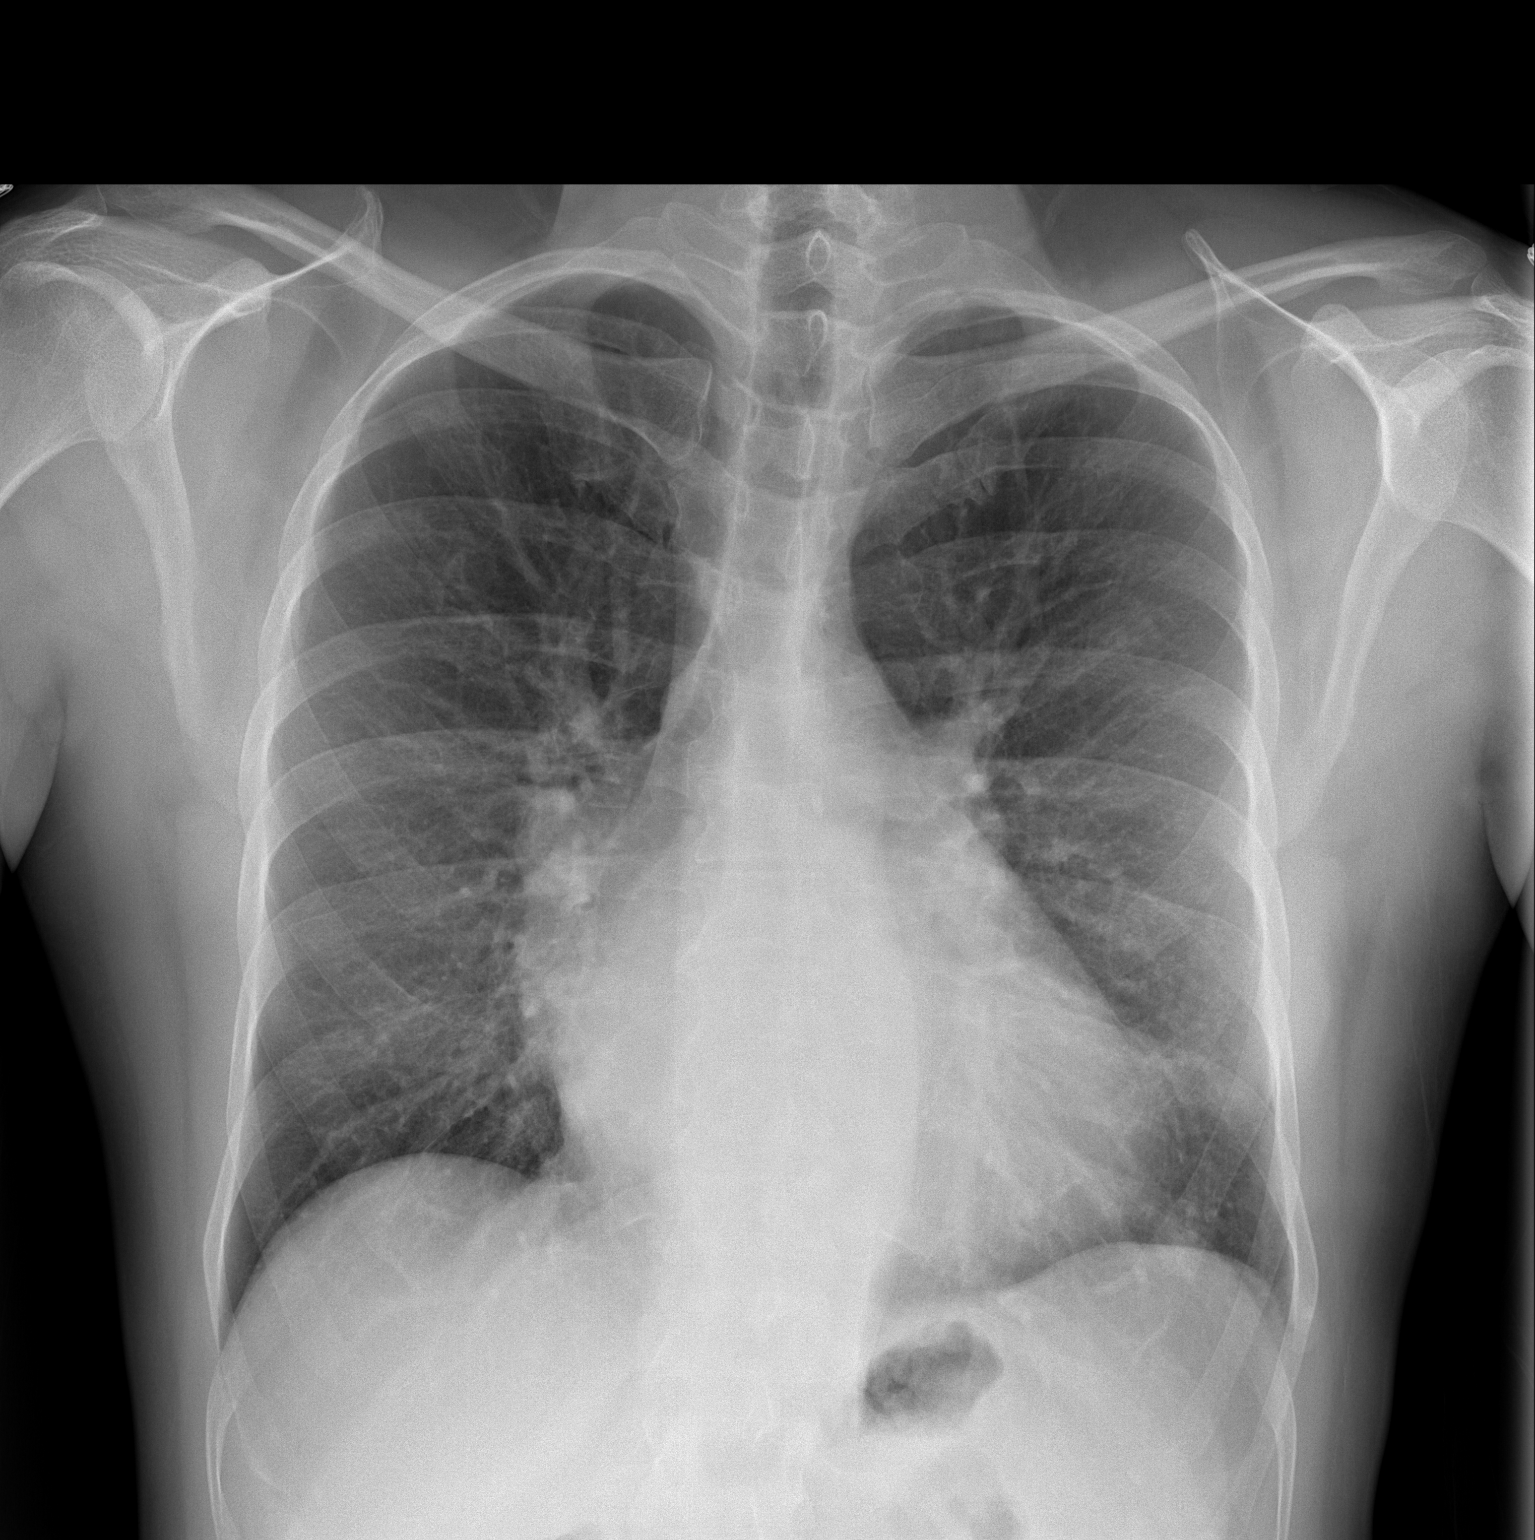

[w chest lat]
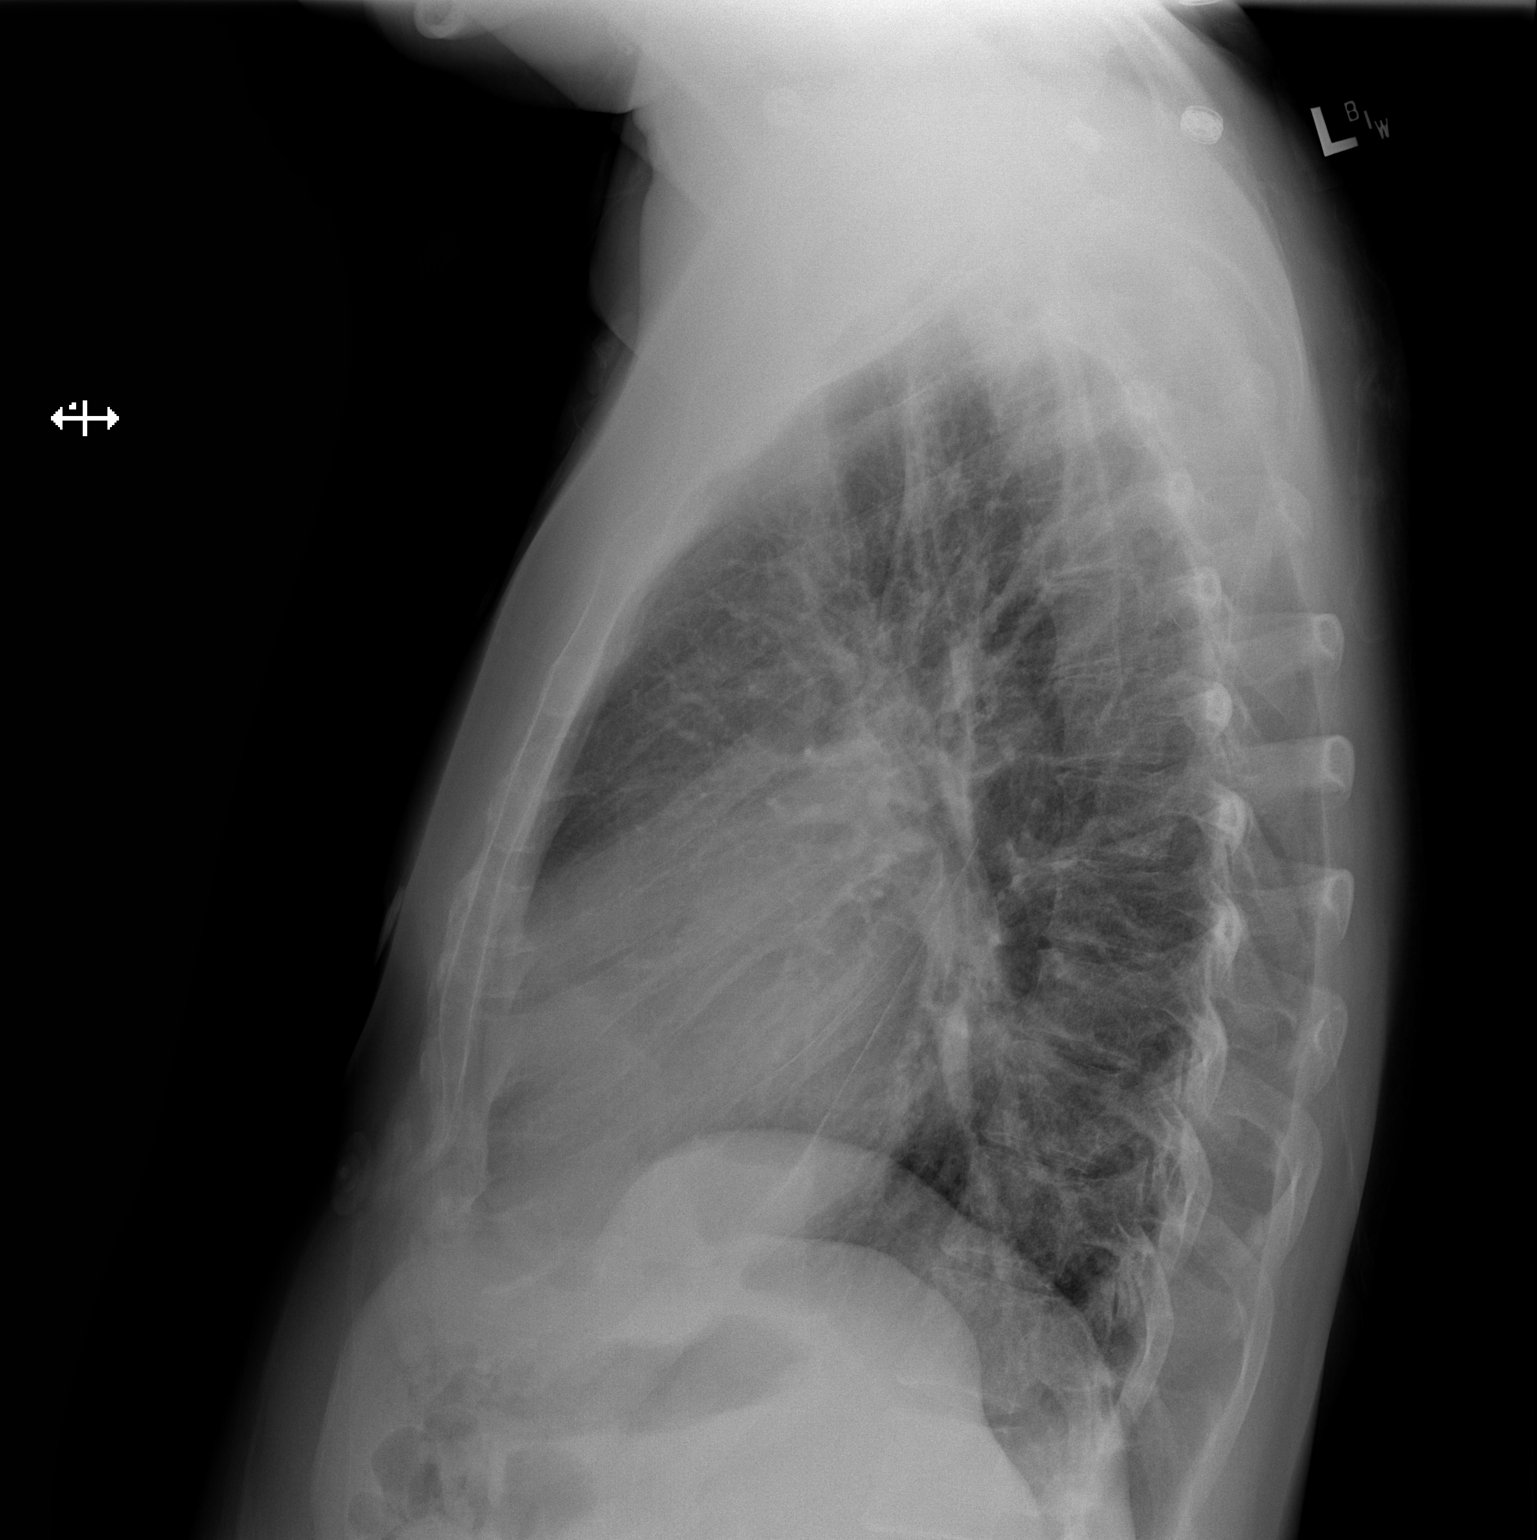

[2 of 2 positions shown; findings below may reference images not displayed]

FINDINGS: There is no edema or consolidation. Heart is mildly enlarged with
pulmonary venous hypertension. No adenopathy. No pneumothorax. No
bone lesions.
IMPRESSION: Findings indicative of pulmonary vascular congestion without frank
edema or consolidation.

## 2019-04-21 ENCOUNTER — Other Ambulatory Visit: Payer: Self-pay | Admitting: *Deleted

## 2019-04-21 MED ORDER — CLOPIDOGREL BISULFATE 75 MG PO TABS: 75.0000 mg | ORAL_TABLET | Freq: Every day | ORAL | 0 refills | Status: DC

## 2019-05-06 ENCOUNTER — Other Ambulatory Visit: Payer: Self-pay

## 2019-05-06 MED ORDER — CLOPIDOGREL BISULFATE 75 MG PO TABS: 75.0000 mg | ORAL_TABLET | Freq: Every day | ORAL | 0 refills | Status: DC

## 2019-06-13 ENCOUNTER — Other Ambulatory Visit: Payer: Self-pay

## 2019-06-13 MED ORDER — CLOPIDOGREL BISULFATE 75 MG PO TABS: 75.0000 mg | ORAL_TABLET | Freq: Every day | ORAL | 0 refills | Status: AC

## 2020-08-30 IMAGING — CR DG CHEST 2V
2 series · 2 of 2 positions shown · non-contrast
Comparison: Chest x-ray dated 01/27/2016.

CLINICAL DATA: MI x 2 years ago. New onset of Left Mid chest wall
pain since 3am. Non smoker.

EXAM:
CHEST - 2 VIEW

[chest pa]
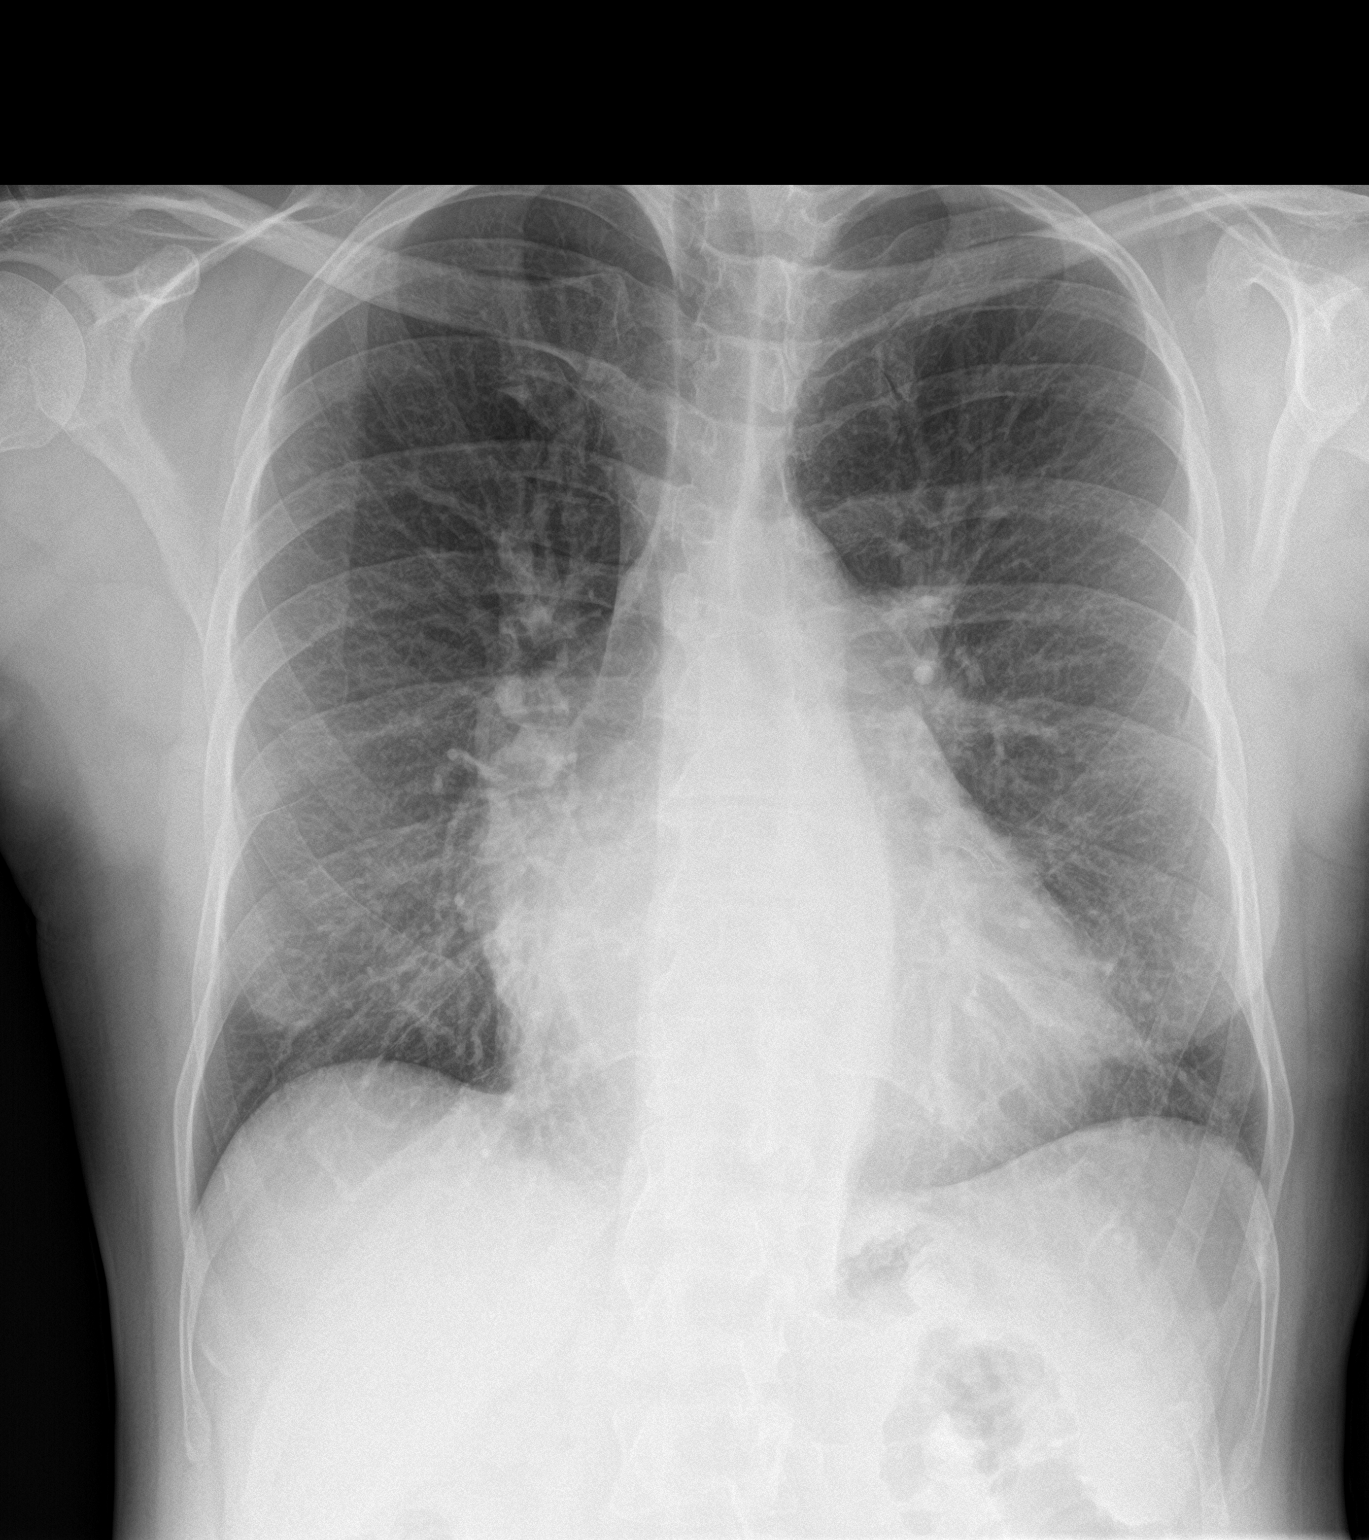

[chest lat]
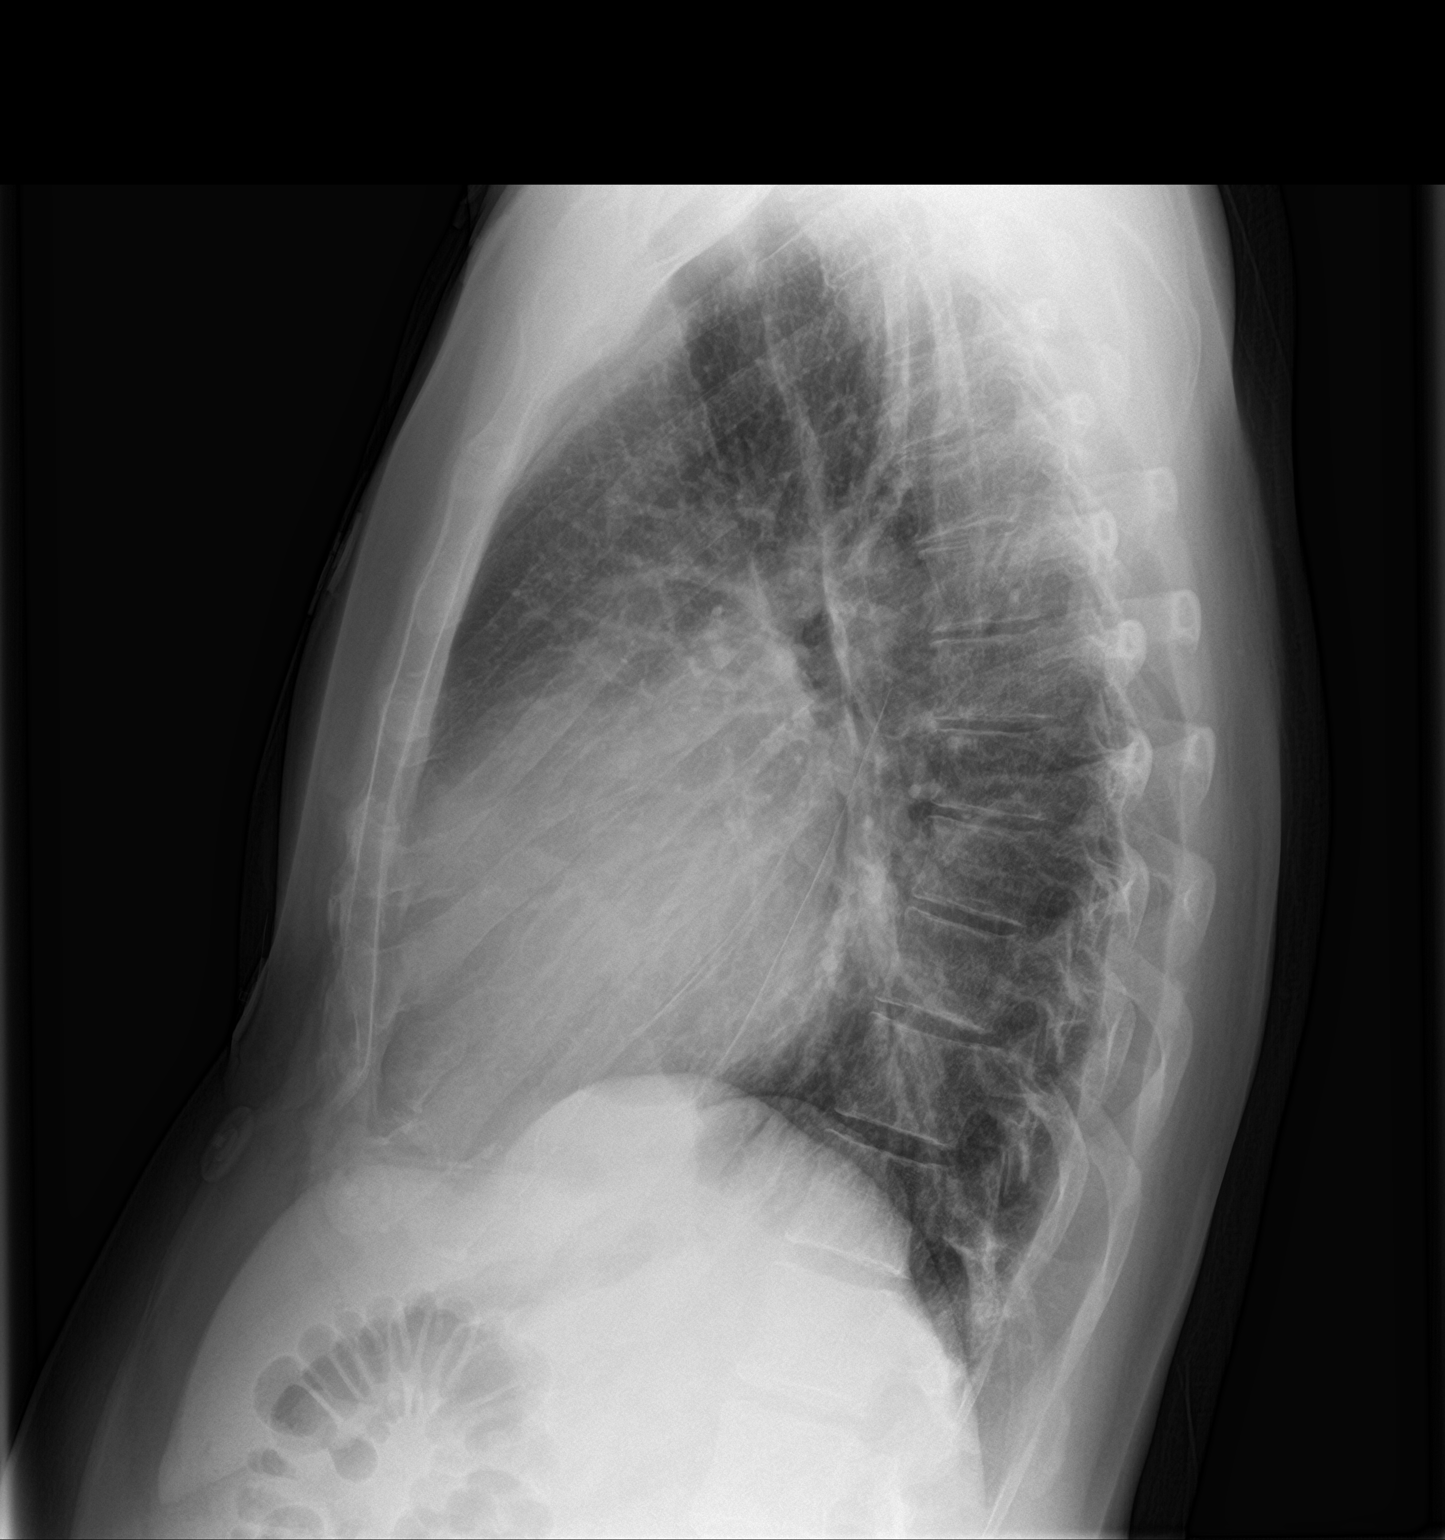

[2 of 2 positions shown; findings below may reference images not displayed]

FINDINGS: Mild cardiomegaly is stable. Perihilar regions are unchanged, more
likely chronic bronchitic change then central pulmonary vascular
congestion. No evidence of consolidating pneumonia, pleural effusion
or pneumothorax. Osseous structures about the chest are
unremarkable.
IMPRESSION: 1. No active cardiopulmonary disease. No evidence of pneumonia or
pulmonary edema.
2. Stable mild cardiomegaly.
3. Suspected chronic bronchitic change.

## 2022-09-29 ENCOUNTER — Other Ambulatory Visit: Payer: Self-pay | Admitting: Cardiovascular Disease

## 2022-09-29 ENCOUNTER — Ambulatory Visit
Admission: RE | Admit: 2022-09-29 | Discharge: 2022-09-29 | Disposition: A | Discharge status: Home / Self Care | Payer: BLUE CROSS/BLUE SHIELD | Source: Ambulatory Visit | Attending: Cardiovascular Disease | Admitting: Cardiovascular Disease

## 2022-09-29 DIAGNOSIS — R200 Anesthesia of skin: Secondary | ICD-10-CM
